# Patient Record
Sex: Female | Born: 1970 | Race: White | Hispanic: No | Marital: Married | Smoking: Current every day smoker
Health system: Southern US, Community
[De-identification: ages and names within clinical notes are randomized; demographics above are authoritative.]

## PROBLEM LIST (undated history)

## (undated) DIAGNOSIS — F319 Bipolar disorder, unspecified: Secondary | ICD-10-CM

## (undated) DIAGNOSIS — F329 Major depressive disorder, single episode, unspecified: Secondary | ICD-10-CM

## (undated) DIAGNOSIS — J45909 Unspecified asthma, uncomplicated: Secondary | ICD-10-CM

## (undated) DIAGNOSIS — F32A Depression, unspecified: Secondary | ICD-10-CM

## (undated) DIAGNOSIS — M5430 Sciatica, unspecified side: Secondary | ICD-10-CM

## (undated) DIAGNOSIS — F419 Anxiety disorder, unspecified: Secondary | ICD-10-CM

## (undated) DIAGNOSIS — M797 Fibromyalgia: Secondary | ICD-10-CM

## (undated) HISTORY — PX: CHOLECYSTECTOMY: SHX55

## (undated) HISTORY — PX: APPENDECTOMY: SHX54

## (undated) HISTORY — PX: ABDOMINAL HYSTERECTOMY: SHX81

---

## 1998-10-01 ENCOUNTER — Other Ambulatory Visit: Admission: RE | Admit: 1998-10-01 | Discharge: 1998-10-01 | Payer: Self-pay | Admitting: Obstetrics and Gynecology

## 2000-10-05 ENCOUNTER — Other Ambulatory Visit: Admission: RE | Admit: 2000-10-05 | Discharge: 2000-10-05 | Payer: Self-pay | Admitting: Obstetrics and Gynecology

## 2001-09-25 ENCOUNTER — Encounter: Payer: Self-pay | Admitting: Family Medicine

## 2001-09-25 ENCOUNTER — Encounter: Admission: RE | Admit: 2001-09-25 | Discharge: 2001-09-25 | Payer: Self-pay | Admitting: Family Medicine

## 2006-10-11 ENCOUNTER — Encounter: Admission: RE | Admit: 2006-10-11 | Discharge: 2006-10-11 | Payer: Self-pay | Admitting: Family Medicine

## 2018-07-12 ENCOUNTER — Emergency Department (HOSPITAL_COMMUNITY)
Admission: EM | Admit: 2018-07-12 | Discharge: 2018-07-13 | Disposition: A | Discharge status: Home / Self Care | Payer: Self-pay | Attending: Emergency Medicine | Admitting: Emergency Medicine

## 2018-07-12 ENCOUNTER — Emergency Department (HOSPITAL_COMMUNITY): Payer: Self-pay

## 2018-07-12 ENCOUNTER — Other Ambulatory Visit: Payer: Self-pay

## 2018-07-12 ENCOUNTER — Encounter (HOSPITAL_COMMUNITY): Payer: Self-pay | Admitting: *Deleted

## 2018-07-12 DIAGNOSIS — J45901 Unspecified asthma with (acute) exacerbation: Secondary | ICD-10-CM | POA: Insufficient documentation

## 2018-07-12 DIAGNOSIS — F17210 Nicotine dependence, cigarettes, uncomplicated: Secondary | ICD-10-CM | POA: Insufficient documentation

## 2018-07-12 DIAGNOSIS — J189 Pneumonia, unspecified organism: Secondary | ICD-10-CM | POA: Insufficient documentation

## 2018-07-12 HISTORY — DX: Fibromyalgia: M79.7

## 2018-07-12 HISTORY — DX: Depression, unspecified: F32.A

## 2018-07-12 HISTORY — DX: Major depressive disorder, single episode, unspecified: F32.9

## 2018-07-12 HISTORY — DX: Sciatica, unspecified side: M54.30

## 2018-07-12 HISTORY — DX: Unspecified asthma, uncomplicated: J45.909

## 2018-07-12 HISTORY — DX: Anxiety disorder, unspecified: F41.9

## 2018-07-12 HISTORY — DX: Bipolar disorder, unspecified: F31.9

## 2018-07-12 LAB — POC URINE PREG, ED: PREG TEST UR: NEGATIVE

## 2018-07-12 LAB — URINALYSIS, ROUTINE W REFLEX MICROSCOPIC
Glucose, UA: NEGATIVE mg/dL
Hgb urine dipstick: NEGATIVE
Ketones, ur: NEGATIVE mg/dL
Leukocytes, UA: NEGATIVE
Nitrite: NEGATIVE
Protein, ur: 100 mg/dL — AB
SPECIFIC GRAVITY, URINE: 1.033 — AB (ref 1.005–1.030)
pH: 5 (ref 5.0–8.0)

## 2018-07-12 LAB — BASIC METABOLIC PANEL
Anion gap: 11 (ref 5–15)
BUN: 9 mg/dL (ref 6–20)
CALCIUM: 9.4 mg/dL (ref 8.9–10.3)
CO2: 27 mmol/L (ref 22–32)
Chloride: 99 mmol/L (ref 98–111)
Creatinine, Ser: 0.88 mg/dL (ref 0.44–1.00)
GFR calc Af Amer: >60 mL/min (ref >60)
Glucose, Bld: 110 mg/dL — ABNORMAL HIGH (ref 70–99)
Potassium: 3 mmol/L — ABNORMAL LOW (ref 3.5–5.1)
Sodium: 137 mmol/L (ref 135–145)

## 2018-07-12 LAB — CBC WITH DIFFERENTIAL/PLATELET
Abs Immature Granulocytes: 0.06 10*3/uL (ref 0.00–0.07)
BASOS PCT: 0 %
Basophils Absolute: 0 10*3/uL (ref 0.0–0.1)
Eosinophils Absolute: 0.3 10*3/uL (ref 0.0–0.5)
Eosinophils Relative: 2 %
HCT: 50 % — ABNORMAL HIGH (ref 36.0–46.0)
Hemoglobin: 16.2 g/dL — ABNORMAL HIGH (ref 12.0–15.0)
Immature Granulocytes: 0 %
Lymphocytes Relative: 17 %
Lymphs Abs: 2.4 10*3/uL (ref 0.7–4.0)
MCH: 30.5 pg (ref 26.0–34.0)
MCHC: 32.4 g/dL (ref 30.0–36.0)
MCV: 94 fL (ref 80.0–100.0)
MONOS PCT: 6 %
Monocytes Absolute: 0.8 10*3/uL (ref 0.1–1.0)
NEUTROS PCT: 75 %
Neutro Abs: 10.5 10*3/uL — ABNORMAL HIGH (ref 1.7–7.7)
Platelets: 205 10*3/uL (ref 150–400)
RBC: 5.32 MIL/uL — ABNORMAL HIGH (ref 3.87–5.11)
RDW: 12.8 % (ref 11.5–15.5)
WBC: 14 10*3/uL — ABNORMAL HIGH (ref 4.0–10.5)
nRBC: 0 % (ref 0.0–0.2)

## 2018-07-12 MED ORDER — ALBUTEROL (5 MG/ML) CONTINUOUS INHALATION SOLN
10.0000 mg/h | INHALATION_SOLUTION | Freq: Once | RESPIRATORY_TRACT | Status: AC
  Administered 2018-07-12: 10 mg/h via RESPIRATORY_TRACT
  Filled 2018-07-12: qty 20

## 2018-07-12 MED ORDER — IOPAMIDOL (ISOVUE-300) INJECTION 61%
75.0000 mL | Freq: Once | INTRAVENOUS | Status: AC | PRN
  Administered 2018-07-12: 75 mL via INTRAVENOUS

## 2018-07-12 MED ORDER — SODIUM CHLORIDE 0.9 % IV BOLUS
500.0000 mL | Freq: Once | INTRAVENOUS | Status: AC
  Administered 2018-07-12: 500 mL via INTRAVENOUS

## 2018-07-12 MED ORDER — PREDNISONE 20 MG PO TABS
40.0000 mg | ORAL_TABLET | Freq: Once | ORAL | Status: AC
Start: 2018-07-12 — End: 2018-07-12
  Administered 2018-07-12: 40 mg via ORAL
  Filled 2018-07-12: qty 2

## 2018-07-12 MED ORDER — ALBUTEROL SULFATE (2.5 MG/3ML) 0.083% IN NEBU
2.5000 mg | INHALATION_SOLUTION | Freq: Once | RESPIRATORY_TRACT | Status: AC
  Administered 2018-07-12: 2.5 mg via RESPIRATORY_TRACT
  Filled 2018-07-12: qty 3

## 2018-07-12 MED ORDER — POTASSIUM CHLORIDE CRYS ER 20 MEQ PO TBCR
40.0000 meq | EXTENDED_RELEASE_TABLET | Freq: Once | ORAL | Status: AC
  Administered 2018-07-12: 40 meq via ORAL
  Filled 2018-07-12: qty 2

## 2018-07-12 MED ORDER — IPRATROPIUM-ALBUTEROL 0.5-2.5 (3) MG/3ML IN SOLN
3.0000 mL | Freq: Once | RESPIRATORY_TRACT | Status: AC
  Administered 2018-07-12: 3 mL via RESPIRATORY_TRACT
  Filled 2018-07-12: qty 3

## 2018-07-12 NOTE — ED Triage Notes (Signed)
Pt c/o fever with cough and dizziness x 2 days; pt states the cough is non-productive

## 2018-07-12 NOTE — ED Notes (Signed)
Patient transported to CT 

## 2018-07-12 NOTE — ED Provider Notes (Signed)
[facility] EMERGENCY DEPARTMENT Provider Note   CSN: [id number] Arrival date & time: 07/12/18  2048     History   Chief Complaint Chief Complaint  Patient presents with  . Fever    HPI Gina Cunningham is a 47 y.o. female.  HPI   Gina Cunningham is a 47 y.o. female asthma who presents to the Emergency Department complaining of cough, chest tightness, and wheezing.  Symptoms have been present for 2 days.  Mild shortness of breath associated with cough as well.  She states she has been using her albuterol inhaler without relief.  She also describes intermittent dizziness associated with excessive coughing or sudden position change.  She denies chest pain, abdominal pain, fever, chills, creased appetite or vomiting.  Other family members have been sick recently.  She states that when she gets sick it usually exacerbates her asthma.   Past Medical History:  Diagnosis Date  . Anxiety   . Asthma   . Bipolar 1 disorder (HCC)   . Depression   . Fibromyalgia   . Sciatica     There are no active problems to display for this patient.   Past Surgical History:  Procedure Laterality Date  . ABDOMINAL HYSTERECTOMY    . APPENDECTOMY    . CESAREAN SECTION    . CHOLECYSTECTOMY       OB History   No obstetric history on file.      Home Medications    Prior to Admission medications   Not on File    Family History History reviewed. No pertinent family history.  Social History Social History   Tobacco Use  . Smoking status: Current Every Day Smoker    Packs/day: 1.00    Types: Cigarettes  . Smokeless tobacco: Never Used  Substance Use Topics  . Alcohol use: Not Currently  . Drug use: Not Currently     Allergies   Penicillins   Review of Systems Review of Systems  Constitutional: Negative for appetite change, chills and fever.  HENT: Positive for congestion and rhinorrhea. Negative for sore throat and trouble swallowing.   Respiratory: Positive for cough, chest  tightness, shortness of breath and wheezing.   Cardiovascular: Negative for chest pain.  Gastrointestinal: Negative for abdominal pain, nausea and vomiting.  Genitourinary: Negative for dysuria.  Musculoskeletal: Negative for arthralgias, neck pain and neck stiffness.  Skin: Negative for rash.  Neurological: Positive for dizziness. Negative for syncope, weakness, light-headedness, numbness and headaches.  Hematological: Negative for adenopathy.     Physical Exam Updated Vital Signs BP 124/73 (BP Location: Right Arm)   Pulse (!) 111   Temp 98 F (36.7 C) (Oral)   Resp 20   Ht 5\' 4"  (1.626 m)   Wt 95.3 kg   SpO2 94%   BMI 36.05 kg/m   Physical Exam Vitals signs and nursing note reviewed.  Constitutional:      General: She is not in acute distress.    Appearance: She is not toxic-appearing.     Comments: Uncomfortable appearing  HENT:     Head: Atraumatic.     Right Ear: Tympanic membrane and ear canal normal.     Left Ear: Tympanic membrane and ear canal normal.     Nose: Congestion present.     Mouth/Throat:     Mouth: Mucous membranes are moist.     Pharynx: Oropharynx is clear. No oropharyngeal exudate or posterior oropharyngeal erythema.  Neck:     Musculoskeletal: Normal range of motion. No neck  rigidity.  Cardiovascular:     Rate and Rhythm: Regular rhythm. Tachycardia present.     Pulses: Normal pulses.  Pulmonary:     Breath sounds: Wheezing present. No rales.     Comments: Diffuse expiratory wheezes and rhonchi are present. Breathing is mildly labored, but she is able to speak in complete sentences without respiratory distress. Abdominal:     General: There is no distension.     Palpations: Abdomen is soft.     Tenderness: There is no abdominal tenderness.  Musculoskeletal: Normal range of motion.        General: No swelling.  Lymphadenopathy:     Cervical: No cervical adenopathy.  Skin:    General: Skin is warm.     Capillary Refill: Capillary refill  takes less than 2 seconds.     Findings: No rash.  Neurological:     General: No focal deficit present.     Mental Status: She is alert. Mental status is at baseline.     Sensory: No sensory deficit.  Psychiatric:        Mood and Affect: Mood normal.      ED Treatments / Results  Labs (all labs ordered are listed, but only abnormal results are displayed) Labs Reviewed  CBC WITH DIFFERENTIAL/PLATELET - Abnormal; Notable for the following components:      Result Value   WBC 14.0 (*)    RBC 5.32 (*)    Hemoglobin 16.2 (*)    HCT 50.0 (*)    Neutro Abs 10.5 (*)    All other components within normal limits  BASIC METABOLIC PANEL - Abnormal; Notable for the following components:   Potassium 3.0 (*)    Glucose, Bld 110 (*)    All other components within normal limits  URINALYSIS, ROUTINE W REFLEX MICROSCOPIC - Abnormal; Notable for the following components:   Color, Urine AMBER (*)    APPearance HAZY (*)    Specific Gravity, Urine 1.033 (*)    Bilirubin Urine SMALL (*)    Protein, ur 100 (*)    Bacteria, UA RARE (*)    All other components within normal limits  POC URINE PREG, ED    EKG None  Radiology Dg Chest 2 View  Result Date: 07/12/2018 CLINICAL DATA:  Fever with nonproductive cough. EXAM: CHEST - 2 VIEW COMPARISON:  None. FINDINGS: The cardiopericardial silhouette is within normal limits for size. Right hilar prominence, likely vasculature. The lungs are clear without focal pneumonia, edema, pneumothorax or pleural effusion. Interstitial markings are diffusely coarsened with chronic features. The visualized bony structures of the thorax are intact. IMPRESSION: Prominence in the right hilum is probably related to pulmonary vessels, but the appearance is asymmetric. CT chest with contrast recommended to exclude lymphadenopathy. Chronic interstitial coarsening without acute cardiopulmonary findings. Electronically Signed   By: Kennith CenterEric  Mansell M.D.   On: 07/12/2018 21:46    Ct Chest W Contrast  Result Date: 07/12/2018 CLINICAL DATA:  Fever and cough with dizziness for 2 days. Right hilar prominence on chest radiograph. EXAM: CT CHEST WITH CONTRAST TECHNIQUE: Multidetector CT imaging of the chest was performed during intravenous contrast administration. CONTRAST:  75mL ISOVUE-300 IOPAMIDOL (ISOVUE-300) INJECTION 61% COMPARISON:  Plain film of earlier today FINDINGS: Cardiovascular: Normal caliber of the aorta and branch vessels. Normal heart size, without pericardial effusion. Pulmonary artery enlargement, outflow tract 3.2 cm No central pulmonary embolism, on this non-dedicated study. Mediastinum/Nodes: Low right paratracheal 1.5 cm node on image 45/2. A right hilar node of  10 mm is not pathologic by size criteria. Upper normal subcarinal node of 12 mm. Lungs/Pleura: No pleural fluid. Bilateral slightly peribronchovascular predominant ground-glass opacity with relative sparing of the right lung base. Upper Abdomen: Cholecystectomy. Moderate to marked hepatic steatosis. Normal imaged portions of the spleen, stomach, pancreas, adrenal glands, left kidney. Musculoskeletal: No acute osseous abnormality. IMPRESSION: 1. Bilateral peribronchovascular ground-glass opacities with relative sparing of the right lung base. Most consistent with infection, including atypical etiologies (i.e. Viral or mycoplasma). 2. Mild thoracic adenopathy, likely reactive. 3. Pulmonary artery enlargement suggests pulmonary arterial hypertension. 4. Hepatic steatosis. Electronically Signed   By: Jeronimo GreavesKyle  Talbot M.D.   On: 07/12/2018 23:31    Procedures Procedures (including critical care time)  Medications Ordered in ED Medications  ipratropium-albuterol (DUONEB) 0.5-2.5 (3) MG/3ML nebulizer solution 3 mL (has no administration in time range)  albuterol (PROVENTIL) (2.5 MG/3ML) 0.083% nebulizer solution 2.5 mg (has no administration in time range)  predniSONE (DELTASONE) tablet 40 mg (has no  administration in time range)     Initial Impression / Assessment and Plan / ED Course  I have reviewed the triage vital signs and the nursing notes.  Pertinent labs & imaging results that were available during my care of the patient were reviewed by me and considered in my medical decision making (see chart for details).     Pt with history of asthma, no improvement with albuterol MDI at home.  Audible wheezing on exam.  No hypoxia.  Will order chest x-ray, prednisone and albuterol neb and reassess.  On recheck after albuterol, patient continues to have audible wheezing and rhonchi.  Tachycardia present.  O2 sat drops to 88-89% but rises to mid 90's with deep breath and coughing.  Will order continuous neb. Mild hypokalemia, potassium given.   0005  Discussed CT chest findings.  Pt still receiving continous neb.    0110  Pt finished continuous albuterol neb.  Lung sounds remain coarse, but overall improved.  She ambulated in the dept with O2 sat of 92% on RA.  Pt prefers d/c home.  She has albuterol nebulizer at home and MDI.  Will give initial abx dose here and rx's for Vantin and zithromax and refill her albuterol vials.  She agrees to close PCP f/u on Monday and strict return precautions discussed.    Final Clinical Impressions(s) / ED Diagnoses   Final diagnoses:  Community acquired pneumonia, unspecified laterality  Moderate asthma with exacerbation, unspecified whether persistent    ED Discharge Orders    None       Pauline Ausriplett, Wanya Bangura, PA-C 07/13/18 0134    Vanetta MuldersZackowski, Scott, MD 07/19/18 [phone]

## 2018-07-13 MED ORDER — AZITHROMYCIN 250 MG PO TABS: ORAL_TABLET | ORAL | 0 refills | Status: DC

## 2018-07-13 MED ORDER — AZITHROMYCIN 250 MG PO TABS
500.0000 mg | ORAL_TABLET | Freq: Once | ORAL | Status: AC
  Administered 2018-07-13: 500 mg via ORAL
  Filled 2018-07-13: qty 2

## 2018-07-13 MED ORDER — AZITHROMYCIN 250 MG PO TABS: 1000.0000 mg | ORAL_TABLET | Freq: Once | ORAL | Status: DC

## 2018-07-13 MED ORDER — ALBUTEROL SULFATE (2.5 MG/3ML) 0.083% IN NEBU: 2.5000 mg | INHALATION_SOLUTION | Freq: Four times a day (QID) | RESPIRATORY_TRACT | 1 refills | Status: DC | PRN

## 2018-07-13 MED ORDER — CEFPODOXIME PROXETIL 200 MG PO TABS: 200.0000 mg | ORAL_TABLET | Freq: Two times a day (BID) | ORAL | 0 refills | Status: DC

## 2018-07-13 NOTE — Discharge Instructions (Addendum)
Its important that you take the antibiotics as directed until they are finished.  Drink plenty of fluids.  Tylenol if needed for fever.  Use your albuterol nebulizer one treatment every 4 hrs as needed while you are sick.  Be sure to follow-up with your primary doctor for recheck on Monday or Tuesday.  Return here for any worsening symptoms.

## 2018-07-13 NOTE — ED Notes (Signed)
Patient 92 percent while ambulating with pulse rate of 132. Patient stated she started to feel a little dizzy while ambulating but states she is feeling better.

## 2020-07-02 ENCOUNTER — Emergency Department (HOSPITAL_COMMUNITY): Admission: EM | Admit: 2020-07-02 | Discharge: 2020-07-02 | Discharge status: Against Medical Advice | Payer: Self-pay

## 2020-07-02 ENCOUNTER — Other Ambulatory Visit: Payer: Self-pay

## 2020-07-06 ENCOUNTER — Emergency Department (HOSPITAL_COMMUNITY)
Admission: EM | Admit: 2020-07-06 | Discharge: 2020-07-06 | Disposition: A | Discharge status: Home / Self Care | Payer: PRIVATE HEALTH INSURANCE | Attending: Emergency Medicine | Admitting: Emergency Medicine

## 2020-07-06 ENCOUNTER — Encounter (HOSPITAL_COMMUNITY): Payer: Self-pay | Admitting: Emergency Medicine

## 2020-07-06 ENCOUNTER — Emergency Department (HOSPITAL_COMMUNITY): Payer: PRIVATE HEALTH INSURANCE

## 2020-07-06 ENCOUNTER — Other Ambulatory Visit: Payer: Self-pay

## 2020-07-06 DIAGNOSIS — S62664A Nondisplaced fracture of distal phalanx of right ring finger, initial encounter for closed fracture: Secondary | ICD-10-CM | POA: Diagnosis not present

## 2020-07-06 DIAGNOSIS — S60944A Unspecified superficial injury of right ring finger, initial encounter: Secondary | ICD-10-CM | POA: Diagnosis present

## 2020-07-06 DIAGNOSIS — J45909 Unspecified asthma, uncomplicated: Secondary | ICD-10-CM | POA: Insufficient documentation

## 2020-07-06 DIAGNOSIS — W108XXA Fall (on) (from) other stairs and steps, initial encounter: Secondary | ICD-10-CM | POA: Diagnosis not present

## 2020-07-06 DIAGNOSIS — F17210 Nicotine dependence, cigarettes, uncomplicated: Secondary | ICD-10-CM | POA: Diagnosis not present

## 2020-07-06 DIAGNOSIS — W19XXXA Unspecified fall, initial encounter: Secondary | ICD-10-CM

## 2020-07-06 DIAGNOSIS — S92412A Displaced fracture of proximal phalanx of left great toe, initial encounter for closed fracture: Secondary | ICD-10-CM

## 2020-07-06 MED ORDER — NAPROXEN 500 MG PO TABS: 500.0000 mg | ORAL_TABLET | Freq: Two times a day (BID) | ORAL | 0 refills | Status: DC | PRN

## 2020-07-06 NOTE — ED Provider Notes (Signed)
[facility] EMERGENCY DEPARTMENT Provider Note   CSN: [id number] Arrival date & time: 07/06/20  1615     History Chief Complaint  Patient presents with  . Finger Injury    Gina Cunningham is a 49 y.o. female with a hx of fibromyalgia, depression, anxiety, & bipolar 1 disorder who presents to the ED with complaints of right 4th finger pain and left foot pain S/p fall 4 days prior. Patient states she tripped going down the last step of a flight and fell forward onto her right hand and knees. Denies head injury or LOC. Having pain to the right 4th finger and to the left 1st/2nd toes. Worse with movement. Associated swelling/bruising- seems to be mildly improving. Denies headache, neck pain, back pain, wounds, chest pain, or abdominal pain.   HPI     Past Medical History:  Diagnosis Date  . Anxiety   . Asthma   . Bipolar 1 disorder (HCC)   . Depression   . Fibromyalgia   . Sciatica     There are no problems to display for this patient.   Past Surgical History:  Procedure Laterality Date  . ABDOMINAL HYSTERECTOMY    . APPENDECTOMY    . CESAREAN SECTION    . CHOLECYSTECTOMY       OB History   No obstetric history on file.     History reviewed. No pertinent family history.  Social History   Tobacco Use  . Smoking status: Current Every Day Smoker    Packs/day: 1.00    Types: Cigarettes  . Smokeless tobacco: Never Used  Vaping Use  . Vaping Use: Never used  Substance Use Topics  . Alcohol use: Not Currently  . Drug use: Not Currently    Home Medications Prior to Admission medications   Medication Sig Start Date End Date Taking? Authorizing Provider  albuterol (PROVENTIL) (2.5 MG/3ML) 0.083% nebulizer solution Take 3 mLs (2.5 mg total) by nebulization every 6 (six) hours as needed for wheezing or shortness of breath. 07/13/18   Triplett, Tammy, PA-C  azithromycin (ZITHROMAX) 250 MG tablet Take first 2 tablets together, then 1 every day until finished. 07/13/18    Triplett, Tammy, PA-C  cefpodoxime (VANTIN) 200 MG tablet Take 1 tablet (200 mg total) by mouth 2 (two) times daily. 07/13/18   Triplett, Tammy, PA-C    Allergies    Penicillins  Review of Systems   Review of Systems  Constitutional: Negative for chills and fever.  Respiratory: Negative for shortness of breath.   Cardiovascular: Negative for chest pain.  Gastrointestinal: Negative for abdominal pain.  Musculoskeletal: Positive for arthralgias and joint swelling. Negative for back pain and neck pain.  Skin: Positive for color change. Negative for wound.  Neurological: Negative for weakness and numbness.  All other systems reviewed and are negative.   Physical Exam Updated Vital Signs BP (!) 111/50 (BP Location: Left Arm)   Pulse (!) 104   Temp 98.4 F (36.9 C) (Oral)   Resp 18   Ht 5\' 4"  (1.626 m)   Wt 117.9 kg   SpO2 99%   BMI 44.63 kg/m   Physical Exam Vitals and nursing note reviewed.  Constitutional:      General: She is not in acute distress.    Appearance: She is not toxic-appearing.  HENT:     Head: Normocephalic and atraumatic.     Comments: No racoon eyes or battle sign.  Eyes:     Pupils: Pupils are equal, round, and reactive to  light.  Neck:     Comments: No midline tenderness.  Cardiovascular:     Rate and Rhythm: Normal rate and regular rhythm.     Comments: 2+ symmetric radial & DP pulses bilaterally.  Pulmonary:     Effort: Pulmonary effort is normal.     Breath sounds: Normal breath sounds.  Chest:     Chest wall: No tenderness.  Abdominal:     General: There is no distension.     Palpations: Abdomen is soft.     Tenderness: There is no abdominal tenderness.  Musculoskeletal:     Cervical back: Normal range of motion and neck supple.     Comments: UEs: Bruising/swelling to the proximal phalanx of the right 4th finger. No open wounds. Intact AROM throughout the upper extremities with the exception of right 4th finger IP/MCP joints with mild  limitation in flexion- able to do so through majority of range of motion and against resistance with flexion/extension. Tender to the right 4th proximal/middle/distal phalanx and PIP/DIP joint. Otherwise nontender.  Back: No midline tenderness or palpable step off.  Lower extremities: Mild bruising to the dorsal aspect of the 1st and 2nd left phalanges/forefoot. No open wounds. Able to move all digits. Intact AROM throughout major joints. Tender to the 1st & 2nd phalanx as well as to the medial forefoot of the left foot. Otherwise nontender to palpation. No tenderness to the base of the 5th, malleoli, or the knees.   Neurological:     Mental Status: She is alert.     Comments: 5/5 strength with plantar/dorsiflexion bilaterally. Sensation grossly intact x 4. Able to perform OK sign, thumbs up and cross 2nd/3rd digits with bilateral upper extremities.     ED Results / Procedures / Treatments   Labs (all labs ordered are listed, but only abnormal results are displayed) Labs Reviewed - No data to display  EKG None  Radiology DG Finger Ring Right  Result Date: 07/06/2020 CLINICAL DATA:  Finger injury, fall EXAM: RIGHT RING FINGER 2+V COMPARISON:  None. FINDINGS: Acute nondisplaced fracture involving the proximal aspect of the fourth distal phalanx with possible articular surface extension on the oblique view. Positive for soft tissue swelling IMPRESSION: Acute nondisplaced distal phalanx fracture with possible articular extension to the DIP joint Electronically Signed   By: Jasmine Pang M.D.   On: 07/06/2020 17:33   DG Foot Complete Left  Result Date: 07/06/2020 CLINICAL DATA:  Toe injury EXAM: LEFT FOOT - COMPLETE 3+ VIEW COMPARISON:  None. FINDINGS: Acute mildly displaced intra-articular fracture involving the medial base of the first proximal phalanx. No subluxation. IMPRESSION: Acute mildly displaced intra-articular fracture involving the base of the first proximal phalanx. Electronically  Signed   By: Jasmine Pang M.D.   On: 07/06/2020 17:31    Procedures .Splint Application  Date/Time: 07/06/2020 5:46 PM Performed by: Cherly Anderson, PA-C Authorized by: Cherly Anderson, PA-C    SPLINT APPLICATION Date/Time: 5:46 PM Authorized by: Harvie Heck Consent: Verbal consent obtained. Risks and benefits: risks, benefits and alternatives were discussed Consent given by: patient Splint applied by: nursing staff Location details: R ring finger Splint type: finger splint Supplies used: finger splint Post-procedure: The splinted body part was neurovascularly unchanged following the procedure. Patient tolerance: Patient tolerated the procedure well with no immediate complications.   (including critical care time)  Medications Ordered in ED Medications - No data to display  ED Course  I have reviewed the triage vital signs and the nursing notes.  Pertinent labs & imaging results that were available during my care of the patient were reviewed by me and considered in my medical decision making (see chart for details).    MDM Rules/Calculators/A&P                         Patient presents to the ED S/p mechanical fall with complaints of right 4th finger pain and left foot pain. Nontoxic. No signs of serious head/neck/back or intra thoracic/abdominal injury on exam.   I have ordered x-rays in areas of tenderness on exam, personally reviewed & interpreted imaging- in agreement with radiology read- left foot: Acute mildly displaced intra-articular fracture involving the base of the first proximal phalanx & right 4th finger: Acute nondisplaced distal phalanx fracture with possible articular extension to the DIP joint  Finger- aluminum splint.  Toe- buddy tape & post op shoe.   Naproxen prescription- chart reviewed for additional hx last creatinine WNL. PRICE. Orthopedics follow up. I discussed results, treatment plan, need for follow-up, and return  precautions with the patient. Provided opportunity for questions, patient confirmed understanding and is in agreement with plan.    Final Clinical Impression(s) / ED Diagnoses Final diagnoses:  Fall, initial encounter  Displaced fracture of proximal phalanx of left great toe, initial encounter for closed fracture  Nondisplaced fracture of distal phalanx of right ring finger, initial encounter for closed fracture    Rx / DC Orders ED Discharge Orders         Ordered    naproxen (NAPROSYN) 500 MG tablet  2 times daily PRN        07/06/20 1751           Cherly Anderson, PA-C 07/06/20 1813    Sabas Sous, MD 07/06/20 2313

## 2020-07-06 NOTE — ED Triage Notes (Signed)
Pt c/o an injury to her right ring finger after a fall last Friday.  pt also states she has an injury to her left big toe from the same fall.

## 2020-07-06 NOTE — Discharge Instructions (Addendum)
Please read and follow all provided instructions.  You have been seen today after injuries related to a fall.  Your xrays show fractures of your left first toe and your right 4th finger.  We have placed you in a splint of the finger, please keep this clean & dry and intact until you have followed up with orthopedics. We have also buddy taped your  1st/2nd toes- please continue to do so daily and utilize post op shoe.  Please call orthopedic surgery tomorrow to schedule an appointment within the next 3 days.   Home care instructions: -- *PRICE in the first 24-48 hours: Protect with splint Rest Ice- Do not apply ice pack directly to your splint place towel or similar between your splint and ice/ice pack. Apply ice for 20 min, then remove for 40 min while awake Compression- splint Elevate affected extremity above the level of your heart when not walking around for the first 24-48 hours   Medications:  - Naproxen is a nonsteroidal anti-inflammatory medication that will help with pain and swelling. Be sure to take this medication as prescribed with food, 1 pill every 12 hours,  It should be taken with food, as it can cause stomach upset, and more seriously, stomach bleeding. Do not take other nonsteroidal anti-inflammatory medications with this such as Advil, Motrin, Aleve, Mobic, Goodie Powder, or Motrin.     We have prescribed you new medication(s) today. Discuss the medications prescribed today with your pharmacist as they can have adverse effects and interactions with your other medicines including over the counter and prescribed medications. Seek medical evaluation if you start to experience new or abnormal symptoms after taking one of these medicines, seek care immediately if you start to experience difficulty breathing, feeling of your throat closing, facial swelling, or rash as these could be indications of a more serious allergic reaction   Follow-up instructions: Please follow-up with the  orthopedic surgeon in your discharge instructions within 1 week.   Return instructions:  Please return if your digits or extremity are numb or tingling, appear gray or blue, or you have severe pain (also elevate the extremity and loosen splint or wrap if you were given one) Please return if you have redness or fevers.  Please return to the Emergency Department if you experience worsening symptoms.  Please return if you have any other emergent concerns. Additional Information:  Your vital signs today were: BP (!) 111/50 (BP Location: Left Arm)   Pulse (!) 104   Temp 98.4 F (36.9 C) (Oral)   Resp 18   Ht 5\' 4"  (1.626 m)   Wt 117.9 kg   SpO2 99%   BMI 44.63 kg/m  If your blood pressure (BP) was elevated above 135/85 this visit, please have this repeated by your doctor within one month. ---------------

## 2021-05-16 IMAGING — DX DG FOOT COMPLETE 3+V*L*
3 series · 3 of 3 positions shown · non-contrast
Comparison: None.

CLINICAL DATA: Toe injury

EXAM:
LEFT FOOT - COMPLETE 3+ VIEW

[foot ap]
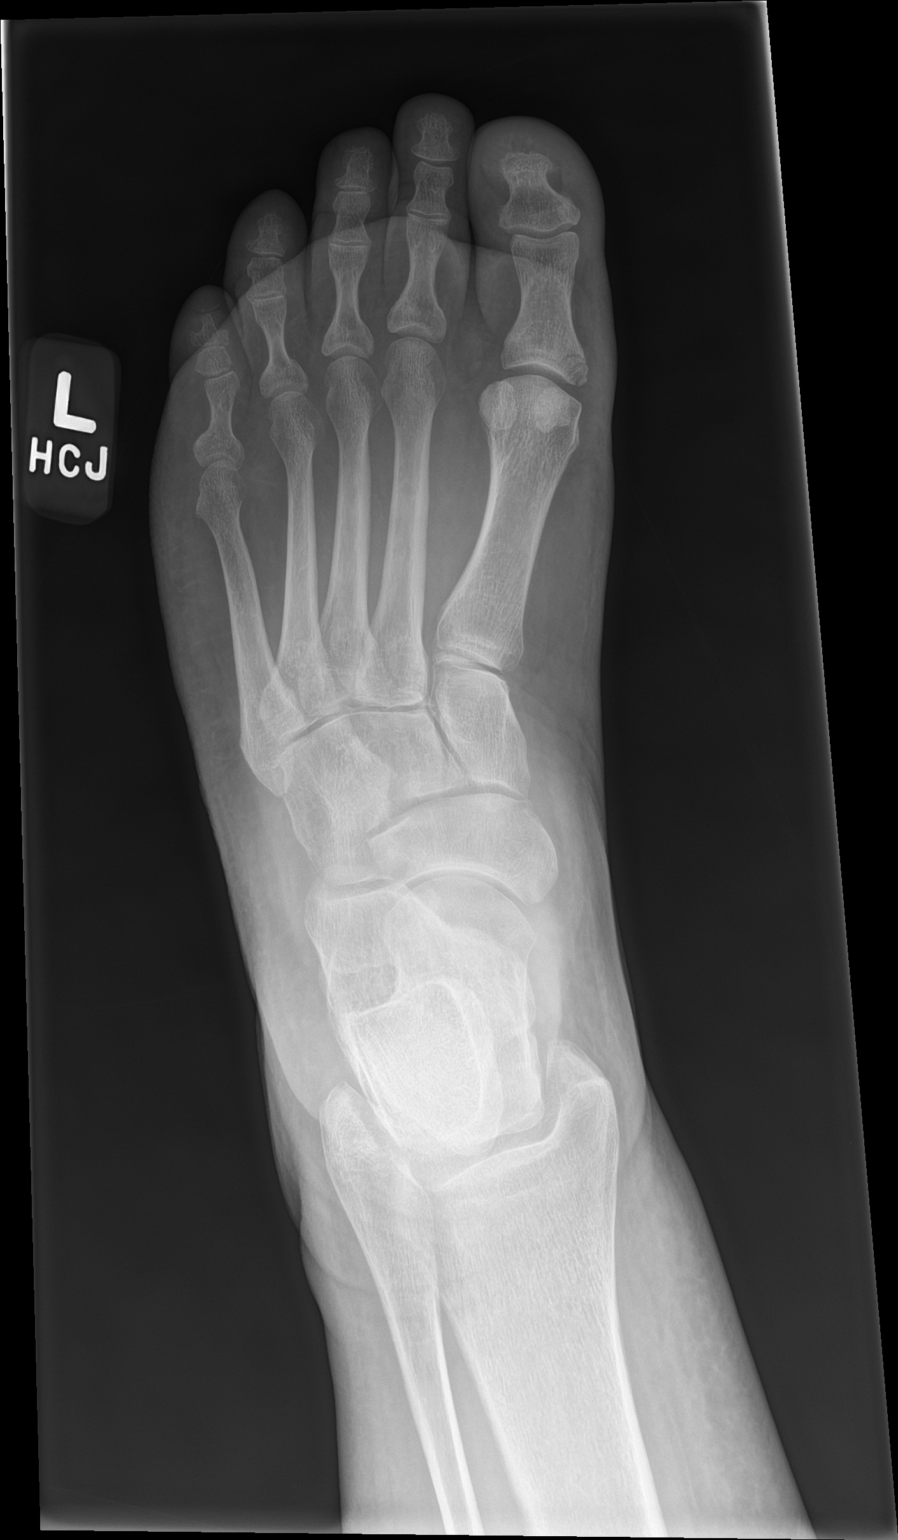

[foot obl]
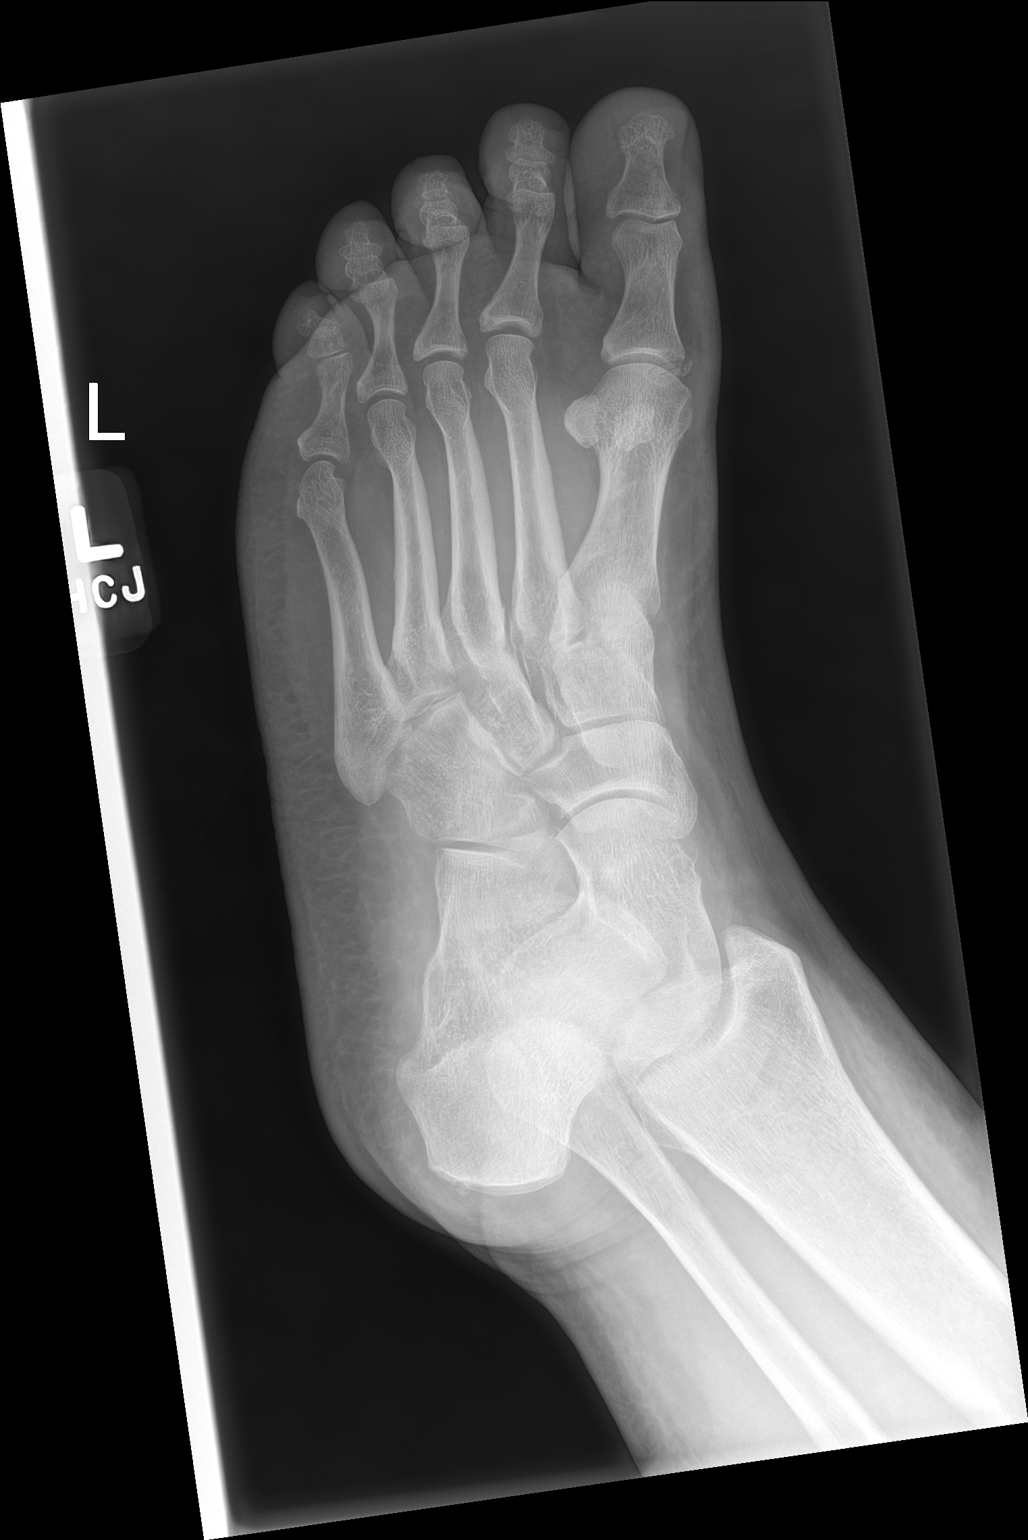

[foot lat]
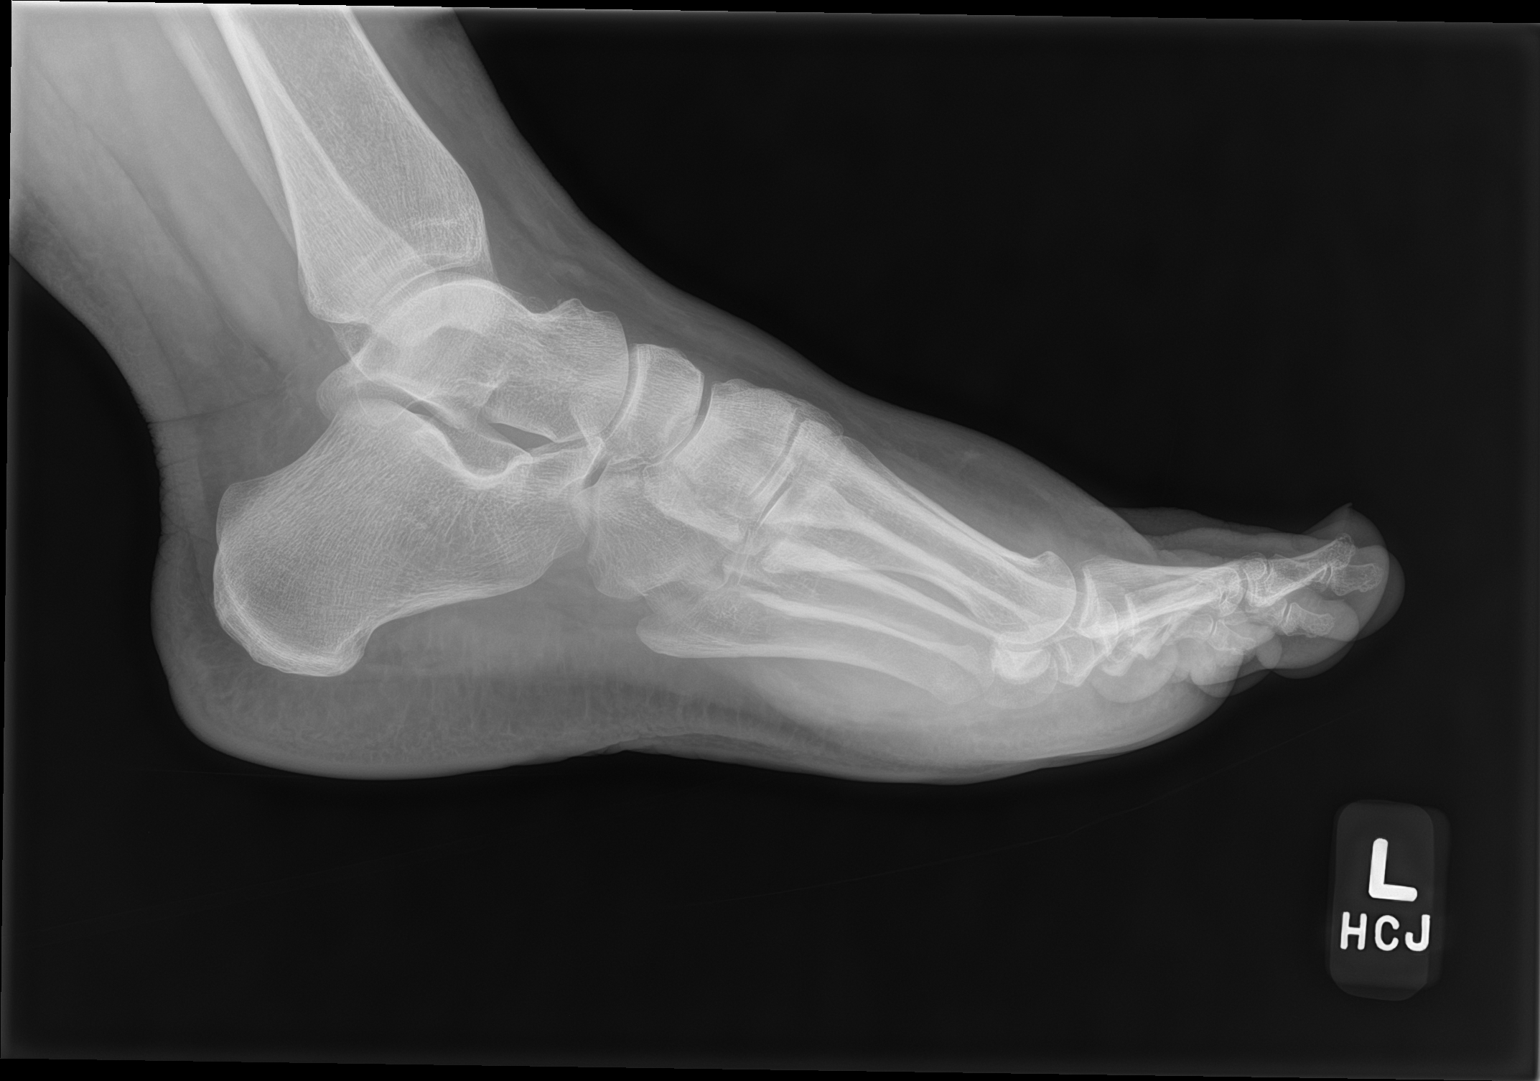

[3 of 3 positions shown; findings below may reference images not displayed]

FINDINGS: Acute mildly displaced intra-articular fracture involving the medial
base of the first proximal phalanx. No subluxation.
IMPRESSION: Acute mildly displaced intra-articular fracture involving the base
of the first proximal phalanx.

## 2022-10-06 ENCOUNTER — Other Ambulatory Visit (HOSPITAL_COMMUNITY): Payer: Self-pay | Admitting: Adult Medicine

## 2022-10-06 DIAGNOSIS — Z1231 Encounter for screening mammogram for malignant neoplasm of breast: Secondary | ICD-10-CM

## 2022-10-18 ENCOUNTER — Ambulatory Visit (HOSPITAL_COMMUNITY): Payer: PRIVATE HEALTH INSURANCE

## 2022-10-18 ENCOUNTER — Encounter (HOSPITAL_COMMUNITY): Payer: Self-pay

## 2022-10-19 ENCOUNTER — Ambulatory Visit (HOSPITAL_COMMUNITY): Payer: PRIVATE HEALTH INSURANCE

## 2022-11-28 ENCOUNTER — Other Ambulatory Visit: Payer: Self-pay

## 2022-11-28 ENCOUNTER — Emergency Department (HOSPITAL_COMMUNITY)
Admission: EM | Admit: 2022-11-28 | Discharge: 2022-11-28 | Disposition: A | Discharge status: Home / Self Care | Payer: No Typology Code available for payment source | Attending: Emergency Medicine | Admitting: Emergency Medicine

## 2022-11-28 ENCOUNTER — Encounter (HOSPITAL_COMMUNITY): Payer: Self-pay | Admitting: Emergency Medicine

## 2022-11-28 DIAGNOSIS — M5442 Lumbago with sciatica, left side: Secondary | ICD-10-CM | POA: Insufficient documentation

## 2022-11-28 DIAGNOSIS — M5432 Sciatica, left side: Secondary | ICD-10-CM | POA: Diagnosis present

## 2022-11-28 MED ORDER — KETOROLAC TROMETHAMINE 30 MG/ML IJ SOLN
30.0000 mg | Freq: Once | INTRAMUSCULAR | Status: AC
  Administered 2022-11-28: 30 mg via INTRAMUSCULAR
  Filled 2022-11-28: qty 1

## 2022-11-28 MED ORDER — PREDNISONE 10 MG (21) PO TBPK: ORAL_TABLET | Freq: Every day | ORAL | 0 refills | Status: DC

## 2022-11-28 MED ORDER — OXYCODONE-ACETAMINOPHEN 5-325 MG PO TABS
1.0000 | ORAL_TABLET | Freq: Once | ORAL | Status: AC
  Administered 2022-11-28: 1 via ORAL
  Filled 2022-11-28: qty 1

## 2022-11-28 MED ORDER — PREDNISONE 50 MG PO TABS
60.0000 mg | ORAL_TABLET | Freq: Once | ORAL | Status: AC
  Administered 2022-11-28: 60 mg via ORAL
  Filled 2022-11-28: qty 1

## 2022-11-28 NOTE — ED Notes (Signed)
See triage notes. Pt tearful

## 2022-11-28 NOTE — Discharge Instructions (Signed)
As discussed, we will send in prednisone taper for your sciatica type pain.  Continue at home medications as prescribed.  Attached is number for follow-up for spinal specialist.  Please do not hesitate to return to emergency department if the worrisome signs and symptoms we discussed become apparent.

## 2022-11-28 NOTE — ED Provider Notes (Signed)
[place] EMERGENCY DEPARTMENT AT [facility] Provider Note   CSN: [id number] Arrival date & time: 11/28/22  1251     History  Chief Complaint  Patient presents with   Leg Pain    Gina Cunningham is a 52 y.o. female.   Leg Pain   52 year old female presents emergency department complaints of "left-sided sciatica."  Patient states she has a history of low back pain as well as intermittent left-sided sciatica.  Follows with neurosurgery outpatient through [facility] for said symptoms.  States that she is has received injections in her spine.  Reports exacerbation symptoms approximately 2 days ago.  Patient states that she had her granddaughter and was playing with her when she noted pain in her left buttock region with radiation down her left foot.  Reports baseline numbness in bilateral feet due to neuropathy with no change.  Denies any weakness in affected leg, saddle anesthesia, bowel/bladder dysfunction, fever, history of IV drug use, prolonged corticosteroid use.  Denies any abdominal pain, urinary symptoms, change in bowel habits.  Past medical history significant for Polar 1 disorder, fibromyalgia, anxiety, sciatica  Home Medications Prior to Admission medications   Medication Sig Start Date End Date Taking? Authorizing Provider  predniSONE (STERAPRED UNI-PAK 21 TAB) 10 MG (21) TBPK tablet Take by mouth daily. Take 6 tabs by mouth daily  for 2 days, then 5 tabs for 2 days, then 4 tabs for 2 days, then 3 tabs for 2 days, 2 tabs for 2 days, then 1 tab by mouth daily for 2 days 11/28/22  Yes Sherian Maroon A, PA  albuterol (PROVENTIL) (2.5 MG/3ML) 0.083% nebulizer solution Take 3 mLs (2.5 mg total) by nebulization every 6 (six) hours as needed for wheezing or shortness of breath. 07/13/18   Triplett, Tammy, PA-C  ALPRAZolam (XANAX) 1 MG tablet Take 1 tablet by mouth 3 (three) times daily. 06/11/20   [provider]  beclomethasone (QVAR) 40 MCG/ACT inhaler Inhale 1 puff into  the lungs daily as needed. 11/21/13   [provider]  DULoxetine (CYMBALTA) 60 MG capsule Take 1 capsule by mouth in the morning and at bedtime. 12/02/19 12/01/20  [provider]  gabapentin (NEURONTIN) 600 MG tablet Take 1 tablet by mouth 3 (three) times daily. 12/02/19 12/01/20  [provider]  levalbuterol Pauline Aus HFA) 45 MCG/ACT inhaler Inhale into the lungs.    [provider]  naproxen (NAPROSYN) 500 MG tablet Take 1 tablet (500 mg total) by mouth 2 (two) times daily as needed for moderate pain. 07/06/20   Petrucelli, Samantha R, PA-C  phentermine 37.5 MG capsule Take 1 capsule by mouth daily. 05/13/20 11/09/20  [provider]  vitamin B-12 (CYANOCOBALAMIN) 100 MCG tablet Take 100 mcg by mouth daily.    [provider]      Allergies    Penicillins    Review of Systems   Review of Systems  All other systems reviewed and are negative.   Physical Exam Updated Vital Signs BP 116/83 (BP Location: Right Arm)   Pulse (!) 106   Temp 98.4 F (36.9 C) (Oral)   Resp 20   Ht 5\' 4"  (1.626 m)   Wt 131.5 kg   SpO2 99%   BMI 49.78 kg/m  Physical Exam Vitals and nursing note reviewed.  Constitutional:      General: She is not in acute distress.    Appearance: She is well-developed.  HENT:     Head: Normocephalic and atraumatic.  Eyes:  Conjunctiva/sclera: Conjunctivae normal.  Cardiovascular:     Rate and Rhythm: Normal rate and regular rhythm.     Heart sounds: No murmur heard. Pulmonary:     Effort: Pulmonary effort is normal. No respiratory distress.     Breath sounds: Normal breath sounds.  Abdominal:     Palpations: Abdomen is soft.     Tenderness: There is no abdominal tenderness.  Musculoskeletal:        General: No swelling.     Cervical back: Neck supple.     Comments: No midline tenderness of cervical, thoracic, lumbar spine with no obvious type of deformity noted.  Tenderness to palpation in left buttock region.   Straight leg raise positive on left.  Muscular strength out of 5 for bilateral lower extremities.  No sensory deficits all major nutrition presents of lower extremities besides plantar aspect of feet bilaterally.  Pedal pulses 2+ bilaterally.  No obvious overlying skin abnormalities.  DTR symmetric and patellar    Skin:    General: Skin is warm and dry.     Capillary Refill: Capillary refill takes less than 2 seconds.  Neurological:     Mental Status: She is alert.  Psychiatric:        Mood and Affect: Mood normal.     ED Results / Procedures / Treatments   Labs (all labs ordered are listed, but only abnormal results are displayed) Labs Reviewed - No data to display  EKG None  Radiology No results found.  Procedures Procedures    Medications Ordered in ED Medications  oxyCODONE-acetaminophen (PERCOCET/ROXICET) 5-325 MG per tablet 1 tablet (1 tablet Oral Given 11/28/22 1357)  ketorolac (TORADOL) 30 MG/ML injection 30 mg (30 mg Intramuscular Given 11/28/22 1357)  predniSONE (DELTASONE) tablet 60 mg (60 mg Oral Given 11/28/22 1357)    ED Course/ Medical Decision Making/ A&P                             Medical Decision Making Risk Prescription drug management.   This patient presents to the ED for concern of left low back pain, this involves an extensive number of treatment options, and is a complaint that carries with it a high risk of complications and morbidity.  The differential diagnosis includes cauda equina, spinal epidural abscess, strain/sprain, dislocation, fracture, sciatica, ischemic limb, DVT, cellulitis, erysipelas   Co morbidities that complicate the patient evaluation  See HPI   Additional history obtained:  Additional history obtained from EMR External records from outside source obtained and reviewed including hospital records   Lab Tests:  N/a   Imaging Studies ordered:  N/a   Cardiac Monitoring: / EKG:  The patient was maintained on a  cardiac monitor.  I personally viewed and interpreted the cardiac monitored which showed an underlying rhythm of: Sinus rhythm   Consultations Obtained:  N/a   Problem List / ED Course / Critical interventions / Medication management  Left sciatica I ordered medication including Toradol, Percocet, prednisone  Reevaluation of the patient after these medicines showed that the patient improved I have reviewed the patients home medicines and have made adjustments as needed   Social Determinants of Health:  Denies tobacco, illicit drug use   Test / Admission - Considered:  Left-sided sciatica Vitals signs significant for initial tachycardia with heart rate of 1060 which decreased with time elapsed and medicines administered on emergency department.. Otherwise within normal range and stable throughout visit. 52 year old female presents  with left-sided sciatica patient without evidence of red flag signs for back pain on physical exam or HPI.  Low suspicion for acute spinal cord compression such as cauda equina/spinal epidural abscess, etc.  No traumatic mechanism of injury so low suspicion for acute fracture/dislocation.  No clinical evidence for acute vascular compromise low suspicion for PAD.  Patient without lower extremity swelling so low suspicion for DVT.  Patient already on gabapentin, muscle relaxer, nonsteroidal medication, opioid medication.  She does state that she has had improvement in the past with prednisone so we will trial prednisone taper outpatient.  Will recommend follow-up with neurosurgery outpatient for reevaluation of symptoms.  Treatment plan discussed at length with patient and she acknowledged understanding was agreeable to said plan. Worrisome signs and symptoms were discussed with the patient, and the patient acknowledged understanding to return to the ED if noticed. Patient was stable upon discharge.          Final Clinical Impression(s) / ED Diagnoses Final  diagnoses:  Left sided sciatica    Rx / DC Orders ED Discharge Orders          Ordered    predniSONE (STERAPRED UNI-PAK 21 TAB) 10 MG (21) TBPK tablet  Daily        11/28/22 1358              Peter Garter, [place] 11/28/22 1558    Gerhard Munch, MD 11/28/22 1625

## 2022-11-28 NOTE — ED Triage Notes (Signed)
Pt via POV c/o left leg pain due to sciatica since yesterday with hx of same. Spinal surgery in 2017. Pt is ambulatory w/o assistance but seems uncomfortable and is tearful in triage. Took 2 prescribed 5-325mg  hydrocodone tablets at around 9am without relief.

## 2022-12-01 ENCOUNTER — Ambulatory Visit (HOSPITAL_COMMUNITY): Payer: PRIVATE HEALTH INSURANCE

## 2022-12-13 ENCOUNTER — Ambulatory Visit
Admission: RE | Admit: 2022-12-13 | Discharge: 2022-12-13 | Disposition: A | Discharge status: Home / Self Care | Payer: No Typology Code available for payment source | Source: Ambulatory Visit | Attending: Nurse Practitioner | Admitting: Nurse Practitioner

## 2022-12-13 ENCOUNTER — Other Ambulatory Visit: Payer: Self-pay | Admitting: Nurse Practitioner

## 2022-12-13 DIAGNOSIS — M545 Low back pain, unspecified: Secondary | ICD-10-CM

## 2023-10-13 ENCOUNTER — Other Ambulatory Visit: Payer: Self-pay

## 2023-10-13 ENCOUNTER — Ambulatory Visit: Admission: EM | Admit: 2023-10-13 | Discharge: 2023-10-13 | Disposition: A | Discharge status: Home / Self Care

## 2023-10-13 ENCOUNTER — Encounter: Payer: Self-pay | Admitting: Emergency Medicine

## 2023-10-13 DIAGNOSIS — L02412 Cutaneous abscess of left axilla: Secondary | ICD-10-CM

## 2023-10-13 MED ORDER — SULFAMETHOXAZOLE-TRIMETHOPRIM 800-160 MG PO TABS: 1.0000 | ORAL_TABLET | Freq: Two times a day (BID) | ORAL | 0 refills | Status: AC

## 2023-10-13 NOTE — Discharge Instructions (Addendum)
 Take antibiotic as prescribed Apply warm compress a few times per day  Return if no improvement or symptoms become worse.

## 2023-10-13 NOTE — ED Triage Notes (Signed)
 Reports a bump to left axilla.  Patient noticed this a week ago.  Wednesday saw someone in pcp office-thought a swollen lymph node.  Was scheduled for an ultrasound April 3.  On Thursday, patient touched this sight and drained immediately. Patient reports this site is slightly swollen.  Marland Kitchen

## 2023-10-13 NOTE — ED Provider Notes (Signed)
 [facility]    CSN: [id number] Arrival date & time: 10/13/23  1516      History   Chief Complaint Chief Complaint  Patient presents with   Abscess    HPI Gina Cunningham is a 53 y.o. female.   Pt presents with a small bump to the left axilla that she noticed about a week ago.  She reports she was seen by her PCP who thought it was just a swollen lymph node.  She states few days ago the area started draining.  She reports draining over the last 2 to 3 days.  She denies fever, chills.  She has tried nothing for the symptoms at this time.  She does report she is scheduled for an ultrasound on April 3.    Past Medical History:  Diagnosis Date   Anxiety    Asthma    Bipolar 1 disorder (HCC)    Depression    Fibromyalgia    Sciatica     There are no active problems to display for this patient.   Past Surgical History:  Procedure Laterality Date   ABDOMINAL HYSTERECTOMY     APPENDECTOMY     CESAREAN SECTION     CHOLECYSTECTOMY      OB History   No obstetric history on file.      Home Medications    Prior to Admission medications   Medication Sig Start Date End Date Taking? Authorizing Provider  ARIPiprazole (ABILIFY) 5 MG tablet Take 1 tablet by mouth daily. 06/18/23 06/17/24 Yes [provider]  cyclobenzaprine (FLEXERIL) 10 MG tablet Take by mouth. 05/30/22 09/19/24 Yes [provider]  sulfamethoxazole-trimethoprim (BACTRIM DS) 800-160 MG tablet Take 1 tablet by mouth 2 (two) times daily for 5 days. 10/13/23 10/18/23 Yes Ward, Tylene Fantasia, PA-C  valACYclovir (VALTREX) 500 MG tablet Take 500 mg by mouth 2 (two) times daily.   Yes [provider]  albuterol (PROVENTIL) (2.5 MG/3ML) 0.083% nebulizer solution Take 3 mLs (2.5 mg total) by nebulization every 6 (six) hours as needed for wheezing or shortness of breath. 07/13/18   Triplett, Tammy, PA-C  ALPRAZolam (XANAX) 1 MG tablet Take 1 tablet by mouth 3 (three) times daily. 06/11/20    [provider]  beclomethasone (QVAR) 40 MCG/ACT inhaler Inhale 1 puff into the lungs daily as needed. 11/21/13   [provider]  DULoxetine (CYMBALTA) 60 MG capsule Take 1 capsule by mouth in the morning and at bedtime. 12/02/19 12/01/20  [provider]  gabapentin (NEURONTIN) 600 MG tablet Take 1 tablet by mouth 3 (three) times daily. 12/02/19 12/01/20  [provider]  levalbuterol Pauline Aus HFA) 45 MCG/ACT inhaler Inhale into the lungs.    [provider]  naproxen (NAPROSYN) 500 MG tablet Take 1 tablet (500 mg total) by mouth 2 (two) times daily as needed for moderate pain. 07/06/20   Petrucelli, Samantha R, PA-C  phentermine 37.5 MG capsule Take 1 capsule by mouth daily. 05/13/20 11/09/20  [provider]  predniSONE (STERAPRED UNI-PAK 21 TAB) 10 MG (21) TBPK tablet Take by mouth daily. Take 6 tabs by mouth daily  for 2 days, then 5 tabs for 2 days, then 4 tabs for 2 days, then 3 tabs for 2 days, 2 tabs for 2 days, then 1 tab by mouth daily for 2 days 11/28/22   Peter Garter, PA  vitamin B-12 (CYANOCOBALAMIN) 100 MCG tablet Take 100 mcg by mouth daily.    [provider]  Family History History reviewed. No pertinent family history.  Social History Social History   Tobacco Use   Smoking status: Former    Current packs/day: 1.00    Types: Cigarettes   Smokeless tobacco: Never  Vaping Use   Vaping status: Some Days  Substance Use Topics   Alcohol use: Not Currently   Drug use: Not Currently     Allergies   Penicillins   Review of Systems Review of Systems  Constitutional:  Negative for chills and fever.  HENT:  Negative for ear pain and sore throat.   Eyes:  Negative for pain and visual disturbance.  Respiratory:  Negative for cough and shortness of breath.   Cardiovascular:  Negative for chest pain and palpitations.  Gastrointestinal:  Negative for abdominal pain and vomiting.  Genitourinary:  Negative for  dysuria and hematuria.  Musculoskeletal:  Negative for arthralgias and back pain.  Skin:  Positive for wound. Negative for color change and rash.  Neurological:  Negative for seizures and syncope.  All other systems reviewed and are negative.    Physical Exam Triage Vital Signs ED Triage Vitals  Encounter Vitals Group     BP 10/13/23 1536 131/73     Systolic BP Percentile --      Diastolic BP Percentile --      Pulse Rate 10/13/23 1536 97     Resp 10/13/23 1536 20     Temp 10/13/23 1536 99.1 F (37.3 C)     Temp Source 10/13/23 1536 Oral     SpO2 10/13/23 1536 99 %     Weight --      Height --      Head Circumference --      Peak Flow --      Pain Score 10/13/23 1532 0     Pain Loc --      Pain Education --      Exclude from Growth Chart --    No data found.  Updated Vital Signs BP 131/73 (BP Location: Right Arm) Comment (BP Location): large cuff  Pulse 97   Temp 99.1 F (37.3 C) (Oral)   Resp 20   SpO2 99%   Visual Acuity Right Eye Distance:   Left Eye Distance:   Bilateral Distance:    Right Eye Near:   Left Eye Near:    Bilateral Near:     Physical Exam Vitals and nursing note reviewed.  Constitutional:      General: She is not in acute distress.    Appearance: She is well-developed.  HENT:     Head: Normocephalic and atraumatic.  Eyes:     Conjunctiva/sclera: Conjunctivae normal.  Cardiovascular:     Rate and Rhythm: Normal rate and regular rhythm.     Heart sounds: No murmur heard. Pulmonary:     Effort: Pulmonary effort is normal. No respiratory distress.     Breath sounds: Normal breath sounds.  Abdominal:     Palpations: Abdomen is soft.     Tenderness: There is no abdominal tenderness.  Musculoskeletal:        General: No swelling.     Cervical back: Neck supple.  Skin:    General: Skin is warm and dry.     Capillary Refill: Capillary refill takes less than 2 seconds.     Comments: Left axilla with small bump, no fluctuance at this  time noted.  No surrounding redness or warmth.  Mild tenderness to palpation..  Neurological:     Mental  Status: She is alert.  Psychiatric:        Mood and Affect: Mood normal.      UC Treatments / Results  Labs (all labs ordered are listed, but only abnormal results are displayed) Labs Reviewed - No data to display  EKG   Radiology No results found.  Procedures Procedures (including critical care time)  Medications Ordered in UC Medications - No data to display  Initial Impression / Assessment and Plan / UC Course  I have reviewed the triage vital signs and the nursing notes.  Pertinent labs & imaging results that were available during my care of the patient were reviewed by me and considered in my medical decision making (see chart for details).     Abscess to left axilla that has drained on its own.  She has some persistent mild tenderness.  No fluctuance noticed at this time no need to drain.  Will initiate antibiotic and advised to follow-up if no improvement.  Supportive care discussed. Final Clinical Impressions(s) / UC Diagnoses   Final diagnoses:  Abscess of axilla, left     Discharge Instructions      Take antibiotic as prescribed Apply warm compress a few times per day  Return if no improvement or symptoms become worse.    ED Prescriptions     Medication Sig Dispense Auth. Provider   sulfamethoxazole-trimethoprim (BACTRIM DS) 800-160 MG tablet Take 1 tablet by mouth 2 (two) times daily for 5 days. 10 tablet Ward, Tylene Fantasia, PA-C      PDMP not reviewed this encounter.   Ward, Tylene Fantasia, PA-C 10/13/23 1550

## 2024-07-29 NOTE — Progress Notes (Signed)
 Location Information: Patient State (at time of visit): [place]  Patient Location (at time of visit):Home/Other Non-Medical  Provider Location: Home Is provider licensed to provide clinical care in the current location/state of the patient? Yes   Consent:  Patient's identity was confirmed. Presenting condition or illness was discussed with the patient/personal representative. Current proposed treatment for presenting condition or illness was explained to patient/personal representative along with the likely benefits and any significant risks or complications associated with the provision of treatment by audio/video means. The patient/personal representative verbally authorized treatment to be provided by audio/video, which may include a limited review of patient's current health status, medication, or other treatment recommendations, patient education, and an opportunity to ask questions about condition and treatment. Verbal Consent Granted by Patient/Personal Representative:Yes   Visit Information: Modality: 2-Way Real-Time Audio/Video  Video Start Time: 1:30pm Video Stop Time: 1:50pm Video Total Time: 20 minutes   [facility]: Postoperative Psychological Therapy Note  *This note may contain sensitive and confidential information regarding the patient's psychosocial adjustment to living with a chronic medical condition.  DO NOT share this information outside of [facility] without written consent from the patient explicitly stating that mental health records may be released.  DOS: Tue 07/29/2024 Service: Individual psychotherapy Billing Codes: 09167 (16-37 minute therapy)  Limits of confidentiality, credentials and role of the psychologist, purpose, nature, and expectations of postoperative visit and risk and benefits of therapy and telehealth were discussed: Yes  Patient verbally consented to the information  reviewed above: Yes  Subjective/Objective: Pt presents today for a 2-4 week postoperative appointment to assess postoperative psychosocial functioning and to obtain support around postoperative lifestyle changes.   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 Encouraged patient to transition to more pureed food based options at this point in time. Appears RD discussed this with patient last week as well. Reviewed appropriate food choices and pt has materials to refer to.    Nutrition and Eating Behaviors:  Typical foods consumed in the past week: 3 protein shakes/day, broccoli and cabbage  Hydration/fluid intake: 64 Patient adhering to appropriate post-op diet phase: Yes Disordered/maladaptive eating behaviors: Denies Cravings: Yes, sweets cravings. Utilizing SF popcicles  Food sensitivity/aversions: Denies Patient reports pain when eating/drinking: No Vitamin adherence: Yes  Psychosocial Symptoms, Behaviors, and Concerns:  Self-reported mood: Good New or worsening psychiatric symptoms/concerns: Denies Body image concerns: Denies Weighing behavior: Daily, concerned about recent weight fluctuations. 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  Interventions: Supportive listening and expressive techniques to normalize patient's  postoperative experiences and assist patient in tolerating and effectively coping with any psychosocial symptoms, negative affect, and/or stressors.  Psychoeducation related to the relationship between emotional distress, interpersonal relationships, weight loss, and food/eating behaviors. Cognitive and behavioral interventions, including cognitive restructuring and behavior modification, related to mood, psychiatric distress, health behavior change, and food/eating behaviors. Provided patient with specific recommendations to optimize bariatric surgery outcomes.  Problem solving techniques to assist developing a plan and setting goals.  In-Session Response:  Patient responded positively to in-session interventions.  Mental Status: Appearance: age appropriate and casually dressed Behavior: normal Motor: unremarkable and within normal limits Level of Consciousness: alert Attitude: cooperative, agreeable, and respectful Speech: normal volume and normal pitch Mood: euthymic Affect: congruent with mood Thought Process: goal-directed Thought Content: logical Memory: No apparent problems Judgment: No apparent problems Insight: No apparent problems  Suicide/Risk Assessment: No active/recent suicidal ideation, intent, or plan. No further risk was indicated or observed.  Potential for Assault/Violence: Low  Diagnostic Impressions: Encounter Diagnoses  Name Primary?   Adjustment disorder, unspecified type Yes   S/P biliopancreatic diversion with duodenal switch      Plan of Care*: Implement behavioral health and dietary goals discussed in today's session and as recommended by surgical team. Attend 3 to 6 month and 9 to 12 month postoperative behavioral health group appointments. Regularly attend monthly behavioral health surgical support group on the 3rd Tuesday of every month at 12pm. Maintain exercise routine as tolerated and recommended by exercise physiologist and medical  providers. Follow-up with surgical team, including behavioral health, as-needed for optimal postoperative care.  * The patient actively participated in the treatment planning process and goal setting  _____________________ Damien BROCKS. Audery, PhD Assistant Professor Licensed Counsellor, [phone] [facility]  [facility]

## 2024-08-07 NOTE — Progress Notes (Addendum)
 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  She was previously unaware of a having cirrhosis until a biopsy was performed at the time of a bariatric surgery by Dr. Winfred last month.  She is unaware of exactly how the liver appeared to the surgery team during the procedure.  She does admit to having a history of fatty liver disease dating back several years.  Last abdominal imaging was a CT performed in 06/2021, which per the interpreting radiologist indicated fatty liver but did not show evidence of cirrhosis at that time. Her liver chemistries largely appear to have been within normal limits other than mild elevations in alkaline phosphatase and ALT occasionally.  She reports having 20 pound weight loss since the surgery.  She reports having 40 additional pound weight loss prior to surgery.  She was diagnosed last week with iron  deficiency anemia and was advised to start supplementing her diet with an iron  supplementation and vitamin C.  She underwent a colonoscopy in 2022 which demonstrated 2 sessile serrated polyps in the sigmoid colon and was advised to have 5-year recall.  She states that she did have an EGD performed preoperatively recently.  She denies having any jaundice or acholic stools.  She denies melena or hematochezia.  She denies ascites lower extremity.  She denies having any confusion.  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  Review of Systems: All other systems were reviewed and are negative.  Physical Exam: Vital Signs: BP 137/71   Pulse 79   Temp 97.9 F (36.6 C) (Temporal)   Ht 1.626 m (5' 4)   Wt 122 kg (268 lb)   LMP  (LMP Unknown)   SpO2 98%   BMI 46.00 kg/m  Constitutional: Well-developed and in no apparent distress.  Accompanied by aunt Eyes: Anicteric sclerae. Ears, Nose, Mouth, Throat: Moist mucous membranes, no oral lesions. Respiratory: Clear lungs to auscultation bilaterally. No wheezes, rales, rhonchi, or crackles.  Cardiovascular: Normal S1 and S2. Regular rate and rhythm. No murmurs, rubs or gallops. No peripheral edema. Gastrointestinal: Abdomen soft, non-tender, and non-distended. No masses palpable.  Several surgical scars are present Musculoskeletal: No clubbing or cyanosis of hands. Normal range of motion in upper and lower extremities. Skin: No spider angiomata, palmar erythema, or jaundice.  Neurologic: Fluent language. No facial asymmetry. Moves all extremities equally. No asterixis. Psychiatric: Alert. Normal affect.  Records Review: I have personally reviewed prior records. The laboratory and imaging data are summarized within the history of present illness and below.   Labs:  [facility] Labs: Lab Results  Component Value Date   WBC 6.40 07/31/2024   RBC 3.43 (L) 07/31/2024   HGB 9.9 (L) 07/31/2024   HCT 29.7 (L) 07/31/2024   MCV 86.5 07/31/2024   MCH 28.8 07/31/2024   MCHC 33.3 07/31/2024   PLT 194 07/31/2024     Chemistry  Component Value Date/Time   NA 144 07/31/2024 1014   K 3.0 (L) 07/31/2024 1014   CL 108 (H) 07/31/2024 1014   CO2 31 07/31/2024 1014   BUN 7 07/31/2024 1014   CREATININE 0.64 07/31/2024 1014      Component Value Date/Time    CALCIUM 8.9 07/31/2024 1014   AST 24 07/31/2024 1014   ALT 24 07/31/2024 1014   BILITOT 0.4 07/31/2024 1014      Imaging:   CT Abdomen with and without contrast (06/30/2021): ABDOMEN/PELVIS .  Kidneys: Redemonstrated right renal cyst on the lower pole, approximately 3.5 cm in diameter, previously up to 5.7 cm. Minimal residual perinephric stranding. No associated loculated drainable collection. Enhancement is symmetric. No hydronephrosis or radiopaque stones. .  Ureters: Visualized portions unremarkable. .  Liver: Redemonstrated diffuse hepatic steatosis. .  Gallbladder/biliary: Cholecystectomy.   Procedures:   Colonoscopy (02/21/2021): Findings Internal hemorrhoids Two sessile polyps measuring 5-9 mm in the sigmoid colon; removed by cold snare; placed 1 clip successfully  Liver Biopsy (06/30/2024): A. Liver, core biopsy:   Hepatic cirrhosis with mild macrovesicular steatosis (affecting approximately 20% of hepatic parenchyma)    and steatohepatitis; see Comment.   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  Assessment & Plan 1.  MASLD cirrhosis: - Presumed diagnosis based on liver biopsy results.  However, her fib-4 score would argue against her having advanced fibrosis (1.29 score).  Regardless, liver biopsy has historically been the gold standard for diagnosing cirrhosis - No symptoms or other signs of decompensated liver disease at this time Plan: - Will check updated INR to calculate MELD score today (I can use the CMP from last week) - Viral hepatitis profile ordered for screening purposes as well as to assess immunity and I will also  check ceruloplasmin as well - AFP and triphasic CT imaging ordered for [facility] screening purposes - I have counseled her at length today on the signs and symptoms of decompensated liver disease to monitor for - Continue healthy diet and exercise for the goal of weight loss, which will ultimately improve her fatty liver disease.  I have explained to her, however, that rapid weight loss can exacerbate cirrhosis and fatty liver disease in the postoperative setting of weight loss surgery.  I have seen this happen on numerous occasions in the past.   - I will update Dr. Winfred that I have seen the patient today and let him know of my recommendations.  I will also touch base with him as well on EGD for possible variceal screening particular in setting of iron  deficiency anemia as well as to discuss findings of the liver during the surgery last month  2.  Iron  deficiency anemia - Denies any overt GI bleeding at this time - Possibly secondary to absorption in the setting of weight loss surgery last month Plan: - As above, will reach out to Dr. Winfred to see if he wishes for our GI team to perform diagnostic EGD - Continue iron  supplementation recommendations otherwise per surgery and PCP    Follow-up: 6 months  Rosalynn Pottier, MD Assistant Professor [facility]  ADDENDUM: Messaged Dr. Winfred. He did EGD during surgery and no evidence of varices. Would like to hold on EGD given possibility of anemia due to operation. Liver appeared nodular grossly during surgery. Thus biopsy was undertaken      [1] Current Outpatient Medications  Medication Sig Dispense Refill   acetaminophen  (TYLENOL ) 500 mg tablet Take 1,000 mg by mouth every 6 (six) hours as needed for mild pain (1-3).     albuterol  HFA (PROVENTIL  HFA;VENTOLIN  HFA;PROAIR  HFA) 90 mcg/actuation inhaler Inhale 2 puffs into the lungs every 4 (four) hours as needed for Wheezing. 8.5 g 11   ALPRAZolam  (XANAX ) 1 mg  tablet Take 1 tablet (1 mg total) by mouth 3 (three) times daily as needed for anxiety. 90 tablet 3   busPIRone (BUSPAR) 30 mg tablet Take 1 tablet (30 mg total) by mouth 2 times daily. 180 tablet 3   cyanocobalamin  (VITAMIN B12) 1,000 mcg tablet Take 1 tablet by mouth daily.     cyclobenzaprine  (FLEXERIL ) 10 mg tablet Take 1 tablet (10 mg total) by mouth 3 (three) times daily as needed for muscles. 90 tablet 2   DULoxetine  (CYMBALTA ) 60 mg capsule TAKE ONE CAPSULE BY MOUTH 2 TIMES A DAY FOR MOOD AND BACK 60 capsule 11   gabapentin  (NEURONTIN ) 600 mg tablet Take 1 tablet (600 mg total) by mouth 3 times daily. 90 tablet 11   ipratropium-albuteroL  (DUO-NEB) 0.5-2.5 mg/3 mL nebulizer solution Take 3 mL by nebulization 3 (three) times a day as needed. (Patient taking differently: Take 3 mL by nebulization 3 (three) times a day as needed for wheezing or shortness of breath.) 270 mL 2   lidocaine HCL-menthoL (Nervive Pain Relieving) 4-1 % srlo Apply 1 Application topically daily as needed (NERVE PAIN).     metFORMIN (GLUCOPHAGE) 500 mg tablet Take 1 tablet (500 mg total) by mouth in the morning and 1 tablet (500 mg total) in the evening. Take with meals. 180 tablet 3   multivitamin (One Daily Multivitamin) tab tablet Take 1 tablet by mouth daily.     omeprazole (PriLOSEC) 20 mg DR capsule Take 1 capsule (20 mg total) by mouth in the morning. 90 capsule 1   promethazine (PHENERGAN) 25 mg tablet Take 1 tablet (25 mg total) by mouth every 6 (six) hours as needed for nausea or vomiting. 20 tablet 0   sennosides-docusate sodium (PERICOLACE) 8.6-50 mg per tablet Take 1 tablet by mouth 2 (two) times a day as needed for constipation. 60 tablet 3   valACYclovir  (VALTREX ) 500 mg tablet Take 1 tablet (500 mg total) by mouth daily. (Patient taking differently: Take 500 mg by mouth daily.) 90 tablet 3   No current facility-administered medications for this visit.

## 2024-08-25 ENCOUNTER — Emergency Department (HOSPITAL_COMMUNITY)
Admission: EM | Admit: 2024-08-25 | Discharge: 2024-08-26 | Disposition: A | Attending: Emergency Medicine | Admitting: Emergency Medicine

## 2024-08-25 ENCOUNTER — Encounter (HOSPITAL_COMMUNITY): Payer: Self-pay

## 2024-08-25 ENCOUNTER — Other Ambulatory Visit: Payer: Self-pay

## 2024-08-25 DIAGNOSIS — F1310 Sedative, hypnotic or anxiolytic abuse, uncomplicated: Secondary | ICD-10-CM | POA: Insufficient documentation

## 2024-08-25 DIAGNOSIS — F32A Depression, unspecified: Secondary | ICD-10-CM

## 2024-08-25 DIAGNOSIS — R45851 Suicidal ideations: Secondary | ICD-10-CM | POA: Insufficient documentation

## 2024-08-25 DIAGNOSIS — F4321 Adjustment disorder with depressed mood: Secondary | ICD-10-CM | POA: Insufficient documentation

## 2024-08-25 DIAGNOSIS — Y900 Blood alcohol level of less than 20 mg/100 ml: Secondary | ICD-10-CM | POA: Insufficient documentation

## 2024-08-25 DIAGNOSIS — F3130 Bipolar disorder, current episode depressed, mild or moderate severity, unspecified: Secondary | ICD-10-CM | POA: Diagnosis present

## 2024-08-25 DIAGNOSIS — F319 Bipolar disorder, unspecified: Secondary | ICD-10-CM | POA: Insufficient documentation

## 2024-08-25 DIAGNOSIS — F314 Bipolar disorder, current episode depressed, severe, without psychotic features: Secondary | ICD-10-CM

## 2024-08-25 DIAGNOSIS — F411 Generalized anxiety disorder: Secondary | ICD-10-CM | POA: Insufficient documentation

## 2024-08-25 DIAGNOSIS — J45909 Unspecified asthma, uncomplicated: Secondary | ICD-10-CM | POA: Insufficient documentation

## 2024-08-25 LAB — CBC WITH DIFFERENTIAL/PLATELET
Abs Immature Granulocytes: 0.03 10*3/uL (ref 0.00–0.07)
Basophils Absolute: 0 10*3/uL (ref 0.0–0.1)
Basophils Relative: 0 %
Eosinophils Absolute: 0.4 10*3/uL (ref 0.0–0.5)
Eosinophils Relative: 4 %
HCT: 37.6 % (ref 36.0–46.0)
Hemoglobin: 11.5 g/dL — ABNORMAL LOW (ref 12.0–15.0)
Immature Granulocytes: 0 %
Lymphocytes Relative: 34 %
Lymphs Abs: 3.5 10*3/uL (ref 0.7–4.0)
MCH: 27.5 pg (ref 26.0–34.0)
MCHC: 30.6 g/dL (ref 30.0–36.0)
MCV: 90 fL (ref 80.0–100.0)
Monocytes Absolute: 0.6 10*3/uL (ref 0.1–1.0)
Monocytes Relative: 6 %
Neutro Abs: 5.6 10*3/uL (ref 1.7–7.7)
Neutrophils Relative %: 56 %
Platelets: 288 10*3/uL (ref 150–400)
RBC: 4.18 MIL/uL (ref 3.87–5.11)
RDW: 13.7 % (ref 11.5–15.5)
WBC: 10.2 10*3/uL (ref 4.0–10.5)
nRBC: 0 % (ref 0.0–0.2)

## 2024-08-25 LAB — COMPREHENSIVE METABOLIC PANEL WITH GFR
ALT: 34 U/L (ref 0–44)
AST: 37 U/L (ref 15–41)
Albumin: 4.2 g/dL (ref 3.5–5.0)
Alkaline Phosphatase: 172 U/L — ABNORMAL HIGH (ref 38–126)
Anion gap: 14 (ref 5–15)
BUN: 6 mg/dL (ref 6–20)
CO2: 23 mmol/L (ref 22–32)
Calcium: 9.5 mg/dL (ref 8.9–10.3)
Chloride: 107 mmol/L (ref 98–111)
Creatinine, Ser: 0.82 mg/dL (ref 0.44–1.00)
GFR, Estimated: >60 mL/min
Glucose, Bld: 108 mg/dL — ABNORMAL HIGH (ref 70–99)
Potassium: 4.2 mmol/L (ref 3.5–5.1)
Sodium: 144 mmol/L (ref 135–145)
Total Bilirubin: 0.4 mg/dL (ref 0.0–1.2)
Total Protein: 7.5 g/dL (ref 6.5–8.1)

## 2024-08-25 LAB — URINE DRUG SCREEN
Amphetamines: NEGATIVE
Barbiturates: NEGATIVE
Benzodiazepines: POSITIVE — AB
Cocaine: NEGATIVE
Fentanyl: NEGATIVE
Methadone Scn, Ur: NEGATIVE
Opiates: NEGATIVE
Tetrahydrocannabinol: NEGATIVE

## 2024-08-25 LAB — ETHANOL: Alcohol, Ethyl (B): <15 mg/dL

## 2024-08-25 LAB — URINALYSIS, ROUTINE W REFLEX MICROSCOPIC
Bilirubin Urine: NEGATIVE
Glucose, UA: NEGATIVE mg/dL
Hgb urine dipstick: NEGATIVE
Ketones, ur: NEGATIVE mg/dL
Leukocytes,Ua: NEGATIVE
Nitrite: NEGATIVE
Protein, ur: NEGATIVE mg/dL
Specific Gravity, Urine: 1.004 — ABNORMAL LOW (ref 1.005–1.030)
pH: 6 (ref 5.0–8.0)

## 2024-08-25 LAB — ACETAMINOPHEN LEVEL: Acetaminophen (Tylenol), Serum: <10 ug/mL — ABNORMAL LOW (ref 10–30)

## 2024-08-25 LAB — AMMONIA: Ammonia: 21 umol/L (ref 9–35)

## 2024-08-25 LAB — SALICYLATE LEVEL: Salicylate Lvl: <7 mg/dL — ABNORMAL LOW (ref 7.0–30.0)

## 2024-08-25 MED ORDER — GABAPENTIN 300 MG PO CAPS
600.0000 mg | ORAL_CAPSULE | Freq: Three times a day (TID) | ORAL | Status: DC
  Administered 2024-08-25: 600 mg via ORAL
  Filled 2024-08-25: qty 2

## 2024-08-25 MED ORDER — DULOXETINE HCL 30 MG PO CPEP
60.0000 mg | ORAL_CAPSULE | Freq: Every day | ORAL | Status: DC
  Administered 2024-08-25: 60 mg via ORAL
  Filled 2024-08-25: qty 2

## 2024-08-25 MED ORDER — TRAZODONE HCL 50 MG PO TABS: 50.0000 mg | ORAL_TABLET | Freq: Every evening | ORAL | Status: DC | PRN

## 2024-08-25 MED ORDER — ALPRAZOLAM 0.5 MG PO TABS
1.0000 mg | ORAL_TABLET | Freq: Three times a day (TID) | ORAL | Status: DC
  Administered 2024-08-25: 1 mg via ORAL
  Filled 2024-08-25: qty 2

## 2024-08-25 NOTE — ED Notes (Signed)
 Pt is consulting with tts.

## 2024-08-25 NOTE — ED Provider Notes (Signed)
 "  EMERGENCY DEPARTMENT AT [facility] Provider Note   CSN: [id number] Arrival date & time: 08/25/24  1637     Patient presents with: Suicidal   Gina Cunningham is a 54 y.o. female.   Patient is a 54 year old female with a past medical history of depression, anxiety, bipolar 1 who presents to the emergency department secondary to increased depression over the past week as well as suicidal thoughts with plan to overdose.  Patient notes that she has not done anything to harm herself as of yet.  She denies any homicidal ideation.  She notes that depression became worse after her diagnosis of cirrhosis back in December.  Patient has had no associated hallucinations or delusions.  Patient has no physical complaints at this time to include fever, chills, chest pain, shortness of breath, abdominal pain, nausea, vomiting, diarrhea.  She notes that she has been compliant with all her home medications.        Prior to Admission medications  Medication Sig Start Date End Date Taking? Authorizing Provider  albuterol  (PROVENTIL ) (2.5 MG/3ML) 0.083% nebulizer solution Take 3 mLs (2.5 mg total) by nebulization every 6 (six) hours as needed for wheezing or shortness of breath. 07/13/18   Triplett, Tammy, PA-C  ALPRAZolam  (XANAX ) 1 MG tablet Take 1 tablet by mouth 3 (three) times daily. 06/11/20   [provider]  ARIPiprazole (ABILIFY) 5 MG tablet Take 1 tablet by mouth daily. 06/18/23 06/17/24  [provider]  beclomethasone (QVAR ) 40 MCG/ACT inhaler Inhale 1 puff into the lungs daily as needed. 11/21/13   [provider]  cyclobenzaprine  (FLEXERIL ) 10 MG tablet Take by mouth. 05/30/22 09/19/24  [provider]  DULoxetine  (CYMBALTA ) 60 MG capsule Take 1 capsule by mouth in the morning and at bedtime. 12/02/19 12/01/20  [provider]  gabapentin  (NEURONTIN ) 600 MG tablet Take 1 tablet by mouth 3 (three) times daily. 12/02/19 12/01/20  [provider]  levalbuterol (XOPENEX HFA) 45 MCG/ACT inhaler Inhale into the lungs.    [provider]  naproxen  (NAPROSYN ) 500 MG tablet Take 1 tablet (500 mg total) by mouth 2 (two) times daily as needed for moderate pain. 07/06/20   Petrucelli, Samantha R, PA-C  phentermine 37.5 MG capsule Take 1 capsule by mouth daily. 05/13/20 11/09/20  [provider]  predniSONE  (STERAPRED UNI-PAK 21 TAB) 10 MG (21) TBPK tablet Take by mouth daily. Take 6 tabs by mouth daily  for 2 days, then 5 tabs for 2 days, then 4 tabs for 2 days, then 3 tabs for 2 days, 2 tabs for 2 days, then 1 tab by mouth daily for 2 days 11/28/22   Silver Wonda LABOR, PA  valACYclovir  (VALTREX ) 500 MG tablet Take 500 mg by mouth 2 (two) times daily.    [provider]  vitamin B-12 (CYANOCOBALAMIN ) 100 MCG tablet Take 100 mcg by mouth daily.    [provider]    Allergies: Penicillins    Review of Systems  Psychiatric/Behavioral:         Depression, suicidal ideation  All other systems reviewed and are negative.   Updated Vital Signs BP (!) 164/109 (BP Location: Right Arm)   Pulse 88   Temp 98.2 F (36.8 C) (Oral)   Resp (!) 24   Ht 5' 4 (1.626 m)   Wt 117.9 kg   SpO2 98%   BMI 44.63 kg/m   Physical Exam Vitals and nursing note reviewed.  Constitutional:  General: She is not in acute distress.    Appearance: Normal appearance. She is not ill-appearing.  HENT:     Head: Normocephalic and atraumatic.     Nose: Nose normal.     Mouth/Throat:     Mouth: Mucous membranes are moist.  Eyes:     Extraocular Movements: Extraocular movements intact.     Conjunctiva/sclera: Conjunctivae normal.     Pupils: Pupils are equal, round, and reactive to light.  Cardiovascular:     Rate and Rhythm: Normal rate and regular rhythm.     Pulses: Normal pulses.     Heart sounds: Normal heart sounds. No murmur heard.    No gallop.  Pulmonary:     Effort: Pulmonary effort is normal. No  respiratory distress.     Breath sounds: Normal breath sounds. No stridor. No wheezing, rhonchi or rales.  Abdominal:     General: Abdomen is flat. Bowel sounds are normal. There is no distension.     Palpations: Abdomen is soft.     Tenderness: There is no abdominal tenderness. There is no guarding.  Musculoskeletal:        General: Normal range of motion.     Cervical back: Normal range of motion and neck supple. No rigidity or tenderness.  Skin:    General: Skin is warm and dry.  Neurological:     General: No focal deficit present.     Mental Status: She is alert and oriented to person, place, and time. Mental status is at baseline.     Cranial Nerves: No cranial nerve deficit.     Sensory: No sensory deficit.     Motor: No weakness.     Coordination: Coordination normal.     Gait: Gait normal.  Psychiatric:        Attention and Perception: Attention and perception normal.        Mood and Affect: Mood is anxious and depressed. Mood is not elated. Affect is tearful. Affect is not labile, blunt, flat, angry or inappropriate.        Speech: Speech normal.        Behavior: Behavior normal. Behavior is cooperative.        Thought Content: Thought content is not paranoid or delusional. Thought content includes suicidal ideation. Thought content does not include homicidal ideation. Thought content includes suicidal plan. Thought content does not include homicidal plan.        Cognition and Memory: Cognition and memory normal.        Judgment: Judgment is not impulsive or inappropriate.     (all labs ordered are listed, but only abnormal results are displayed) Labs Reviewed  COMPREHENSIVE METABOLIC PANEL WITH GFR  ETHANOL  URINE DRUG SCREEN  CBC WITH DIFFERENTIAL/PLATELET  SALICYLATE LEVEL  ACETAMINOPHEN  LEVEL  URINALYSIS, ROUTINE W REFLEX MICROSCOPIC    EKG: None  Radiology: No results found.   Procedures   Medications Ordered in the ED - No data to display  Clinical  Course as of 08/25/24 2214  Mon Aug 25, 2024  2113 Patient did state to the nursing staff that she wants to leave at this time as she states that she is no longer suicidal.  Did discuss with the patient that she has been evaluated by psychiatry and is recommended inpatient admission.  She does state that she continues to want to leave at this point.  IVC paperwork has been completed. [CR]    Clinical Course User Index [CR] Daralene Lonni BIRCH, PA-C  Medical Decision Making Patient is medically clear for TTS evaluation at this time.  Patient has been evaluated by TTS who is recommending inpatient admission.  I was notified by nursing staff that patient wanted to leave as she states that she is no longer suicidal and does not want to wait.  IVC paperwork has been initiated given patient's initial comments about suicidal ideation and her previous attempts.  Do not suspect that she is stable for discharge home at this time.  Amount and/or Complexity of Data Reviewed Labs: ordered.        Final diagnoses:  None    ED Discharge Orders     None          Daralene Lonni JONETTA DEVONNA 08/25/24 2215    Dean Clarity, MD 08/25/24 2226  "

## 2024-08-25 NOTE — BH Assessment (Signed)
 Hi, TTS Consult will be deferred to IRIS. IRIS Coordinator will provide a time for the assessment. Secure chat initiated with patient's care team.

## 2024-08-25 NOTE — ED Notes (Signed)
 Pt saying she wants to go home, says she is tired of sitting here and not suicidal anymore! PA made aware and at bedside talking with pt

## 2024-08-25 NOTE — ED Triage Notes (Addendum)
 Pt arrives via POV c/o being depressed starting on Thursday. Pt states, Family and everyone would be better off if I wasn't here anymore. I'm tired of living. This seems to have started after developing Cirrhosis in December. Pt states, I just want to give up. Pt has had previous suicide attempts years ago.

## 2024-08-25 NOTE — ED Notes (Signed)
 Pt belongings placed in bag consisting of: Pants. shirt, sweatshirt, bra, clogs, 2 necklaces

## 2024-08-26 ENCOUNTER — Encounter (HOSPITAL_COMMUNITY): Payer: Self-pay | Admitting: Psychiatry

## 2024-08-26 ENCOUNTER — Inpatient Hospital Stay (HOSPITAL_COMMUNITY): Admission: AD | Admit: 2024-08-26 | Source: Intra-hospital | Admitting: Psychiatry

## 2024-08-26 DIAGNOSIS — F332 Major depressive disorder, recurrent severe without psychotic features: Principal | ICD-10-CM | POA: Diagnosis present

## 2024-08-26 DIAGNOSIS — F3130 Bipolar disorder, current episode depressed, mild or moderate severity, unspecified: Secondary | ICD-10-CM | POA: Diagnosis present

## 2024-08-26 DIAGNOSIS — F411 Generalized anxiety disorder: Secondary | ICD-10-CM | POA: Diagnosis present

## 2024-08-26 MED ORDER — DIPHENHYDRAMINE HCL 25 MG PO CAPS
50.0000 mg | ORAL_CAPSULE | Freq: Three times a day (TID) | ORAL | Status: AC | PRN
  Filled 2024-08-26: qty 2

## 2024-08-26 MED ORDER — TRAZODONE HCL 50 MG PO TABS
50.0000 mg | ORAL_TABLET | Freq: Every evening | ORAL | Status: AC | PRN
  Administered 2024-08-27 – 2024-08-29 (×3): 50 mg via ORAL
  Filled 2024-08-26 (×3): qty 1

## 2024-08-26 MED ORDER — MAGNESIUM HYDROXIDE 400 MG/5ML PO SUSP: 30.0000 mL | Freq: Every day | ORAL | Status: AC | PRN

## 2024-08-26 MED ORDER — DIPHENHYDRAMINE HCL 50 MG/ML IJ SOLN: 50.0000 mg | Freq: Three times a day (TID) | INTRAMUSCULAR | Status: AC | PRN

## 2024-08-26 MED ORDER — LORAZEPAM 2 MG/ML IJ SOLN: 2.0000 mg | Freq: Three times a day (TID) | INTRAMUSCULAR | Status: AC | PRN

## 2024-08-26 MED ORDER — ALPRAZOLAM 0.5 MG PO TABS
1.0000 mg | ORAL_TABLET | Freq: Two times a day (BID) | ORAL | Status: DC | PRN
  Administered 2024-08-26 – 2024-08-27 (×3): 1 mg via ORAL
  Filled 2024-08-26 (×3): qty 2

## 2024-08-26 MED ORDER — HALOPERIDOL LACTATE 5 MG/ML IJ SOLN: 10.0000 mg | Freq: Three times a day (TID) | INTRAMUSCULAR | Status: AC | PRN

## 2024-08-26 MED ORDER — HALOPERIDOL LACTATE 5 MG/ML IJ SOLN: 5.0000 mg | Freq: Three times a day (TID) | INTRAMUSCULAR | Status: AC | PRN

## 2024-08-26 MED ORDER — HALOPERIDOL 5 MG PO TABS: 5.0000 mg | ORAL_TABLET | Freq: Three times a day (TID) | ORAL | Status: AC | PRN

## 2024-08-26 NOTE — Group Note (Signed)
 Date:  08/26/2024 Time:  10:31 PM  Group Topic/Focus:  Wrap-Up Group:   The focus of this group is to help patients review their daily goal of treatment and discuss progress on daily workbooks.    Participation Level:  Active  Participation Quality:  Appropriate and Sharing  Affect:  Appropriate, Flat, and Tearful  Cognitive:  Appropriate  Insight: Appropriate  Engagement in Group:  Engaged  Modes of Intervention:  Activity and Socialization  Additional Comments:  Patients completed wrap up group sheets and shared. Patient stated that he is feeling, just ok, my first day here. Patient rated her day a 1, worst day ever. First time I have ever been handcuffed and shackled. High point from patient day, seeing my daughter. Low point from patient day, having to say good bye.   Eward Mace 08/26/2024, 10:31 PM

## 2024-08-26 NOTE — Progress Notes (Signed)
 Pt has been accepted to [facility] on 08/26/2024 Bed assignment: 301-01  Pt meets inpatient criteria per: Cathaleen Jacobson NP  Attending Physician will be: Dr. Prentis    Report can be called to: Adult unit: [phone]  Pt can arrive after RN WILL UPDATE   Care Team Notified: [facility] McNichol RN, [place] Mangrum NP  [place] Nhung Danko LCSW  08/26/2024 9:10 AM

## 2024-08-26 NOTE — Group Note (Signed)
 Date:  08/26/2024 Time:  1:41 PM  Group Topic/Focus: Mental Health Video Emotional Education:   The focus of this group is to discuss what feelings/emotions are, and how they are experienced.    Participation Level:  Did Not Attend  Participation Quality:  NA  Affect:  NA  Cognitive:  NA  Insight: NA  Engagement in Group:  NA  Modes of Intervention:  NA  Additional Comments:  Patient did not attend group.  Gina Cunningham Gina Cunningham 08/26/2024, 1:41 PM

## 2024-08-26 NOTE — Group Note (Signed)
 LCSW Group Therapy Note   Group Date: 08/26/2024 Start Time: 1100 End Time: 1200   Participation:  did not attend  Type of Therapy and Topic:  Group Therapy  Topic:  Healing From Within: Understanding Our Past, Building Our Future.  Objective:  To help participants understand the impact of early experiences on mental and physical health, with a focus on Adverse Childhood Experiences (ACEs), and to explore ways to build resilience and healing.  Group Goals: Understand ACEs and Their Impact: Learn how childhood experiences shape mental and physical health. Build Resilience: Develop strategies for overcoming challenges and creating positive change. Promote Healing: Recognize the value of support and the possibility of healing through therapy and self-care.  Summary: In today's session, we discussed how early experiences, especially ACEs, impact mental and physical health. We explored the effects of stress, abuse, and neglect on brain development and well-being. The group focused on resilience, understanding that healing and positive change are possible with support and self-awareness.  Therapeutic Modalities:   Psychoeducation: Sharing information about ACEs and their effects. Cognitive Behavioral Therapy (CBT): Helping reframe negative thought patterns. Trauma-Informed Therapy: Creating a safe, supportive space for healing.   Tavone Caesar O Voyd Groft, LCSW 08/26/2024  12:12 PM

## 2024-08-26 NOTE — Progress Notes (Signed)
 Pt was brought to the facility by police officer in paper scrubs. She was sullen and said that she was triggered by her husband buying a bag with a pentagon on it. She destroyed the bag which was followed by an argument with her husband causing her even more distress. She said that she feels like giving up on life but doesn't have a plan for suicide. She has two previous SA; One by cutting her right wrist and the other by OD. Last attempt was on 07/2013. She is requesting a visit by a Chaplain so one was ordered. She does attend a church service weekly. She feels lied to by the previous doctor in the ED and by the police. She nearly had a panic attack when brought to the unit. She was given a PRN which greatly helped her out. She attended group and mixed with peers well. She uses a Bipap which she said that she will ask her daughter to bring in for her to use. She had gastric bypass surgery last December and at that time she was diagnosed with cirrhosis of the liver which she is struggling with that diagnosis since she doesn't drink alcohol.

## 2024-08-26 NOTE — Plan of Care (Signed)
 Pt was out in the milieu during the day acting appropriately. Went to [employer] and ate adequately. Attending group activity and participated. Denies SI/HI/SH/paranoia/AVH. Will continue to monitor.

## 2024-08-26 NOTE — Group Note (Signed)
 Date:  08/26/2024 Time:  6:44 PM  Group Topic/Focus:  Making Healthy Choices:   The focus of this group is to help patients identify negative/unhealthy choices they were using prior to admission and identify positive/healthier coping strategies to replace them upon discharge. Taught actions that lead to a healthy brain.    Participation Level:  Active  Participation Quality:  Attentive  Affect:  Appropriate  Cognitive:  Appropriate  Insight: Appropriate  Engagement in Group:  Engaged  Modes of Intervention:  Discussion and Education  Additional Comments:    Gina Cunningham 08/26/2024, 6:44 PM

## 2024-08-27 ENCOUNTER — Encounter (HOSPITAL_COMMUNITY): Payer: Self-pay

## 2024-08-27 MED ORDER — BECLOMETHASONE DIPROPIONATE 40 MCG/ACT IN AERS: 1.0000 | INHALATION_SPRAY | Freq: Every day | RESPIRATORY_TRACT | Status: DC | PRN

## 2024-08-27 MED ORDER — ALBUTEROL SULFATE HFA 108 (90 BASE) MCG/ACT IN AERS: 1.0000 | INHALATION_SPRAY | Freq: Four times a day (QID) | RESPIRATORY_TRACT | Status: AC | PRN

## 2024-08-27 MED ORDER — GABAPENTIN 300 MG PO CAPS
600.0000 mg | ORAL_CAPSULE | Freq: Three times a day (TID) | ORAL | Status: AC
  Administered 2024-08-27 – 2024-08-29 (×8): 600 mg via ORAL
  Filled 2024-08-27 (×8): qty 2

## 2024-08-27 MED ORDER — VITAMIN D 25 MCG (1000 UNIT) PO TABS
1000.0000 [IU] | ORAL_TABLET | Freq: Every day | ORAL | Status: AC
  Administered 2024-08-27 – 2024-08-29 (×3): 1000 [IU] via ORAL
  Filled 2024-08-27 (×3): qty 1

## 2024-08-27 MED ORDER — HYDROXYZINE HCL 25 MG PO TABS
25.0000 mg | ORAL_TABLET | Freq: Three times a day (TID) | ORAL | Status: AC | PRN
  Administered 2024-08-27 – 2024-08-29 (×3): 25 mg via ORAL
  Filled 2024-08-27 (×3): qty 1

## 2024-08-27 MED ORDER — VALACYCLOVIR HCL 500 MG PO TABS
500.0000 mg | ORAL_TABLET | Freq: Two times a day (BID) | ORAL | Status: AC
  Administered 2024-08-27 – 2024-08-29 (×5): 500 mg via ORAL
  Filled 2024-08-27 (×5): qty 1

## 2024-08-27 MED ORDER — PANTOPRAZOLE SODIUM 40 MG PO TBEC
40.0000 mg | DELAYED_RELEASE_TABLET | Freq: Every day | ORAL | Status: AC
  Administered 2024-08-27 – 2024-08-29 (×3): 40 mg via ORAL
  Filled 2024-08-27 (×3): qty 1

## 2024-08-27 MED ORDER — BUDESONIDE 0.25 MG/2ML IN SUSP: 0.2500 mg | Freq: Every day | RESPIRATORY_TRACT | Status: DC | PRN

## 2024-08-27 MED ORDER — VITAMIN B-12 100 MCG PO TABS
100.0000 ug | ORAL_TABLET | Freq: Every day | ORAL | Status: AC
  Administered 2024-08-27 – 2024-08-29 (×3): 100 ug via ORAL
  Filled 2024-08-27 (×3): qty 1

## 2024-08-27 MED ORDER — TAB-A-VITE/IRON PO TABS
1.0000 | ORAL_TABLET | Freq: Every day | ORAL | Status: AC
  Administered 2024-08-27 – 2024-08-29 (×3): 1 via ORAL
  Filled 2024-08-27 (×3): qty 1

## 2024-08-27 MED ORDER — DULOXETINE HCL 60 MG PO CPEP
60.0000 mg | ORAL_CAPSULE | Freq: Every day | ORAL | Status: AC
  Administered 2024-08-27 – 2024-08-29 (×3): 60 mg via ORAL
  Filled 2024-08-27 (×3): qty 1

## 2024-08-27 MED ORDER — CYCLOBENZAPRINE HCL 10 MG PO TABS
10.0000 mg | ORAL_TABLET | Freq: Three times a day (TID) | ORAL | Status: AC
  Administered 2024-08-27 – 2024-08-29 (×8): 10 mg via ORAL
  Filled 2024-08-27 (×8): qty 1

## 2024-08-27 NOTE — Plan of Care (Signed)
   Problem: Activity: Goal: Interest or engagement in activities will improve Outcome: Progressing

## 2024-08-27 NOTE — Group Note (Signed)
 Date:  08/27/2024 Time:  3:46 PM  Group Topic/Focus: Write a letter to your past or future self Emotional Education:   The focus of this group is to discuss what feelings/emotions are, and how they are experienced.    Participation Level:  Active  Participation Quality:  Appropriate  Affect:  Appropriate  Cognitive:  Appropriate  Insight: Appropriate  Engagement in Group:  Engaged  Modes of Intervention:  Discussion  Additional Comments:  Patient engaged appropriately during group.   Starlena Beil D Kimiyah Blick 08/27/2024, 3:46 PM

## 2024-08-27 NOTE — [facility] Group Notes (Signed)
 [facility] Group Notes:  (Nursing/MHT/Case Management/Adjunct)  Date:  08/27/2024  Time:  9:13 PM  Type of Therapy:  Group Therapy  Participation Level:  Minimal  Participation Quality:  Attentive  Affect:  Appropriate  Cognitive:  Appropriate  Insight:  Appropriate  Engagement in Group:  Developing/Improving  Modes of Intervention:  Education  Summary of Progress/Problems: Patient attended the evening N.A. meeting and was appropriate.   Gina Cunningham S 08/27/2024, 9:13 PM

## 2024-08-27 NOTE — BH IP Treatment Plan (Signed)
 Interdisciplinary Treatment and Diagnostic Plan Update  08/27/2024 Time of Session: 1005 Gina Cunningham MRN: [id number]  Principal Diagnosis: MDD (major depressive disorder), recurrent severe, without psychosis (HCC)  Secondary Diagnoses: Principal Problem:   MDD (major depressive disorder), recurrent severe, without psychosis (HCC)   Current Medications:  Current Facility-Administered Medications  Medication Dose Route Frequency Provider Last Rate Last Admin   ALPRAZolam  (XANAX ) tablet 1 mg  1 mg Oral BID PRN Prentis Kitchens A, DO   1 mg at 08/27/24 0802   haloperidol  (HALDOL ) tablet 5 mg  5 mg Oral TID PRN Onuoha, Chinwendu V, NP       And   diphenhydrAMINE  (BENADRYL ) capsule 50 mg  50 mg Oral TID PRN Onuoha, Chinwendu V, NP       haloperidol  lactate (HALDOL ) injection 5 mg  5 mg Intramuscular TID PRN Onuoha, Chinwendu V, NP       And   diphenhydrAMINE  (BENADRYL ) injection 50 mg  50 mg Intramuscular TID PRN Onuoha, Chinwendu V, NP       And   LORazepam  (ATIVAN ) injection 2 mg  2 mg Intramuscular TID PRN Onuoha, Chinwendu V, NP       haloperidol  lactate (HALDOL ) injection 10 mg  10 mg Intramuscular TID PRN Onuoha, Chinwendu V, NP       And   diphenhydrAMINE  (BENADRYL ) injection 50 mg  50 mg Intramuscular TID PRN Onuoha, Chinwendu V, NP       And   LORazepam  (ATIVAN ) injection 2 mg  2 mg Intramuscular TID PRN Onuoha, Chinwendu V, NP       magnesium  hydroxide (MILK OF MAGNESIA) suspension 30 mL  30 mL Oral Daily PRN Onuoha, Chinwendu V, NP       traZODone  (DESYREL ) tablet 50 mg  50 mg Oral QHS PRN Onuoha, Chinwendu V, NP       PTA Medications: Medications Prior to Admission  Medication Sig Dispense Refill Last Dose/Taking   ascorbic acid (VITAMIN C) 500 MG tablet Take 500 mg by mouth daily.   Taking   calcium citrate (CALCITRATE - DOSED IN MG ELEMENTAL CALCIUM) 950 (200 Ca) MG tablet Take 200 mg of elemental calcium by mouth daily.   Taking   cholecalciferol  (VITAMIN D3) 25 MCG (1000  UNIT) tablet Take 1,000 Units by mouth daily.   Taking   ferrous sulfate 325 (65 FE) MG EC tablet Take 325 mg by mouth 3 (three) times daily with meals.   Taking   Multiple Vitamins-Iron  (MULTIVITAMINS WITH IRON ) TABS tablet Take 1 tablet by mouth daily.   Taking   omeprazole (PRILOSEC) 20 MG capsule Take 20 mg by mouth daily.   Taking   ALPRAZolam  (XANAX ) 1 MG tablet Take 1 tablet by mouth 3 (three) times daily.      beclomethasone (QVAR ) 40 MCG/ACT inhaler Inhale 1 puff into the lungs daily as needed.      cyclobenzaprine  (FLEXERIL ) 10 MG tablet Take by mouth.      DULoxetine  (CYMBALTA ) 60 MG capsule Take 1 capsule by mouth in the morning and at bedtime.      gabapentin  (NEURONTIN ) 600 MG tablet Take 1 tablet by mouth 3 (three) times daily.      valACYclovir  (VALTREX ) 500 MG tablet Take 500 mg by mouth 2 (two) times daily.      vitamin B-12 (CYANOCOBALAMIN ) 100 MCG tablet Take 100 mcg by mouth daily.       Patient Stressors:    Patient Strengths:    Treatment Modalities: Medication Management, Group  therapy, Case management,  1 to 1 session with clinician, Psychoeducation, Recreational therapy.   Physician Treatment Plan for Primary Diagnosis: MDD (major depressive disorder), recurrent severe, without psychosis (HCC) Long Term Goal(s): Improvement in symptoms so as ready for discharge   Short Term Goals: Ability to identify and develop effective coping behaviors will improve Ability to maintain clinical measurements within normal limits will improve Compliance with prescribed medications will improve Ability to identify triggers associated with substance abuse/mental health issues will improve Ability to identify changes in lifestyle to reduce recurrence of condition will improve Ability to verbalize feelings will improve Ability to disclose and discuss suicidal ideas Ability to demonstrate self-control will improve  Medication Management: Evaluate patient's response, side effects,  and tolerance of medication regimen.  Therapeutic Interventions: 1 to 1 sessions, Unit Group sessions and Medication administration.  Evaluation of Outcomes: Not Progressing  Physician Treatment Plan for Secondary Diagnosis: Principal Problem:   MDD (major depressive disorder), recurrent severe, without psychosis (HCC)  Long Term Goal(s): Improvement in symptoms so as ready for discharge   Short Term Goals: Ability to identify and develop effective coping behaviors will improve Ability to maintain clinical measurements within normal limits will improve Compliance with prescribed medications will improve Ability to identify triggers associated with substance abuse/mental health issues will improve Ability to identify changes in lifestyle to reduce recurrence of condition will improve Ability to verbalize feelings will improve Ability to disclose and discuss suicidal ideas Ability to demonstrate self-control will improve     Medication Management: Evaluate patient's response, side effects, and tolerance of medication regimen.  Therapeutic Interventions: 1 to 1 sessions, Unit Group sessions and Medication administration.  Evaluation of Outcomes: Not Progressing   RN Treatment Plan for Primary Diagnosis: MDD (major depressive disorder), recurrent severe, without psychosis (HCC) Long Term Goal(s): Knowledge of disease and therapeutic regimen to maintain health will improve  Short Term Goals: Ability to remain free from injury will improve, Ability to verbalize frustration and anger appropriately will improve, Ability to demonstrate self-control, Ability to participate in decision making will improve, Ability to verbalize feelings will improve, Ability to disclose and discuss suicidal ideas, Ability to identify and develop effective coping behaviors will improve, and Compliance with prescribed medications will improve  Medication Management: RN will administer medications as ordered by  provider, will assess and evaluate patient's response and provide education to patient for prescribed medication. RN will report any adverse and/or side effects to prescribing provider.  Therapeutic Interventions: 1 on 1 counseling sessions, Psychoeducation, Medication administration, Evaluate responses to treatment, Monitor vital signs and CBGs as ordered, Perform/monitor CIWA, COWS, AIMS and Fall Risk screenings as ordered, Perform wound care treatments as ordered.  Evaluation of Outcomes: Not Progressing   LCSW Treatment Plan for Primary Diagnosis: MDD (major depressive disorder), recurrent severe, without psychosis (HCC) Long Term Goal(s): Safe transition to appropriate next level of care at discharge, Engage patient in therapeutic group addressing interpersonal concerns.  Short Term Goals: Engage patient in aftercare planning with referrals and resources, Increase social support, Increase ability to appropriately verbalize feelings, Increase emotional regulation, Facilitate acceptance of mental health diagnosis and concerns, Facilitate patient progression through stages of change regarding substance use diagnoses and concerns, Identify triggers associated with mental health/substance abuse issues, and Increase skills for wellness and recovery  Therapeutic Interventions: Assess for all discharge needs, 1 to 1 time with Social worker, Explore available resources and support systems, Assess for adequacy in community support network, Educate family and significant other(s) on suicide  prevention, Complete Psychosocial Assessment, Interpersonal group therapy.  Evaluation of Outcomes: Not Progressing   Progress in Treatment: Attending groups: No. Participating in groups: No. Taking medication as prescribed: Yes. Toleration medication: Yes. Family/Significant other contact made: No, will contact:  Consents pending. Patient understands diagnosis: Yes. Discussing patient identified problems/goals  with staff: Yes. Medical problems stabilized or resolved: No. Denies suicidal/homicidal ideation: Yes. Issues/concerns per patient self-inventory: No.   New problem(s) identified: No, Describe:  None  New Short Term/Long Term Goal(s): medication stabilization, elimination of SI thoughts, development of comprehensive mental wellness plan.    Patient Goals:  Pt would like to have medications restarted, improve communication skills with husband and gain new coping strategies.  Discharge Plan or Barriers: Patient recently admitted. CSW will continue to follow and assess for appropriate referrals and possible discharge planning.    Reason for Continuation of Hospitalization: Medication stabilization Suicidal ideation  Estimated Length of Stay: 5-7 days  Last 3 [place] Suicide Severity Risk Score: Flowsheet Row Ad[facility] (Current) [facility] from 08/25/2024 in [facility] at [facility] from 10/13/2023 in [facility] RISK CATEGORY High Risk High Risk No Risk    Last PHQ 2/9 Scores:     No data to display          Scribe for Treatment Team: Derick JONELLE Blanch, LCSW 08/27/2024 11:46 AM

## 2024-08-27 NOTE — Plan of Care (Signed)
   Problem: Education: Goal: Knowledge of [place] [facility] Education information/materials will improve Outcome: Progressing Goal: Emotional status will improve Outcome: Progressing Goal: Mental status will improve Outcome: Progressing Goal: Verbalization of understanding the information provided will improve Outcome: Progressing

## 2024-08-27 NOTE — Progress Notes (Signed)
" °   08/27/24 1600  Psych Admission Type (Psych Patients Only)  Admission Status Voluntary  Psychosocial Assessment  Patient Complaints Worrying;Anxiety  Eye Contact Fair  Facial Expression Sad  Affect Sad  Speech Logical/coherent  Interaction Assertive  Motor Activity Slow  Appearance/Hygiene Unremarkable  Behavior Characteristics Anxious  Mood Anxious;Sad  Thought Process  Coherency WDL  Content Blaming others;Religiosity  Delusions None reported or observed  Perception WDL  Hallucination None reported or observed  Judgment Impaired  Confusion None  Danger to Self  Current suicidal ideation? Denies  Danger to Others  Danger to Others None reported or observed    "
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
## 2024-08-27 NOTE — Group Note (Signed)
 Date:  08/27/2024 Time:  9:21 AM  Group Topic/Focus:  Goals Group:   The focus of this group is to help patients establish daily goals to achieve during treatment and discuss how the patient can incorporate goal setting into their daily lives to aide in recovery.    Participation Level:  Active  Participation Quality:  Appropriate  Affect:  Appropriate  Cognitive:  Appropriate  Insight: Appropriate  Engagement in Group:  Engaged  Modes of Intervention:  Activity  Additional Comments:    Dangela How 08/27/2024, 9:21 AM

## 2024-08-27 NOTE — Progress Notes (Signed)
 Pt complaining of right flank pain along with urinary urgency, frequency, and pain that begin 2 days ago.  Patient also concerned about non movable mass in right flank.

## 2024-08-27 NOTE — Group Note (Signed)
 Recreation Therapy Group Note   Group Topic:Communication  Group Date: 08/27/2024 Start Time: 0930 End Time: 1000 Facilitators: Atzel Mccambridge-McCall, LRT,CTRS Location: [address] Day[address]   Group Topic: Communication, Team Building, Problem Solving  Goal Area(s) Addresses:  Patient will effectively work with peer towards shared goal.  Patient will identify skills used to make activity successful.  Patient will identify how skills used during activity can be applied to reach post d/c goals.   Behavioral Response: Engaged  Intervention: STEM Activity- Glass Blower/designer  Activity: Tallest [employer]. In teams of 5-6, patients were given 11 craft pipe cleaners. Using the materials provided, patients were instructed to compete again the opposing team(s) to build the tallest free-standing structure from floor level. The activity was timed; difficulty increased by clinical research associate as production designer, theatre/television/film continued.  Systematically resources were removed with additional directions for example, placing one arm behind their back, working in silence, and shape stipulations. LRT facilitated post-activity discussion reviewing team processes and necessary communication skills involved in completion. Patients were encouraged to reflect how the skills utilized, or not utilized, in this activity can be incorporated to positively impact support systems post discharge.  Education: Pharmacist, Community, Scientist, Physiological, Discharge Planning   Education Outcome: Acknowledges education/In group clarification offered/Needs additional education.    Affect/Mood: Appropriate   Participation Level: Engaged   Participation Quality: Independent   Behavior: Appropriate   Speech/Thought Process: Focused   Insight: Good   Judgement: Good   Modes of Intervention: STEM Activity   Patient Response to Interventions:  Engaged   Education Outcome:  In group clarification offered    Clinical  Observations/Individualized Feedback: Pt was engaged and focused. Pt worked well with peers in coming up with ideas and putting those ideas into action. Pt showed some skepticism during activity when the obstacles were added. Pt was able to work through those doubts and be successful with peers.      Plan: Continue to engage patient in RT group sessions 2-3x/week.   Arnold Kester-McCall, LRT,CTRS  08/27/2024 12:22 PM
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
## 2024-08-28 MED ORDER — LORAZEPAM 1 MG PO TABS
1.0000 mg | ORAL_TABLET | Freq: Three times a day (TID) | ORAL | Status: AC | PRN
  Administered 2024-08-28 – 2024-08-29 (×3): 1 mg via ORAL
  Filled 2024-08-28 (×3): qty 1

## 2024-08-28 NOTE — B[facility] Group Notes (Signed)
 Patient did attend Social Work Group.

## 2024-08-28 NOTE — B[facility] Group Notes (Signed)
 Patient did attend Occupational Therapy Group.

## 2024-08-28 NOTE — Progress Notes (Signed)
" °   08/28/24 1000  Psych Admission Type (Psych Patients Only)  Admission Status Involuntary  Psychosocial Assessment  Patient Complaints Anxiety;Worrying  Eye Contact Fair  Facial Expression Flat  Affect Sad  Speech Logical/coherent  Interaction Assertive  Motor Activity Slow  Appearance/Hygiene Unremarkable  Behavior Characteristics Anxious  Mood Anxious;Sad  Thought Process  Coherency WDL  Content Blaming others  Delusions None reported or observed  Perception WDL  Hallucination None reported or observed  Judgment Impaired  Confusion None  Danger to Self  Current suicidal ideation? Denies  Description of Suicide Plan No Plan  Agreement Not to Harm Self Yes  Description of Agreement Verbal  Danger to Others  Danger to Others None reported or observed    "

## 2024-08-28 NOTE — Group Note (Addendum)
 Recreation Therapy Group Note   Group Topic:Other  Group Date: 08/28/2024 Start Time: 1303 End Time: 1347 Facilitators: Sayaka Hoeppner-McCall, LRT,CTRS Location: [address] Dayroom   Activity Description/Intervention: Therapeutic Drumming. Patients with peers and staff were given the opportunity to engage in a leader facilitated [facility] Group Empowerment Drumming Circle with staff from the [employer], in partnership with [employer]. Teaching laboratory technician and trained [employer], Norleen Mon leading with LRT observing and documenting intervention and pt response. This evidenced-based practice targets 7 areas of health and wellbeing in the human experience including: stress-reduction, exercise, self-expression, camaraderie/support, nurturing, spirituality, and music-making (leisure).   Goal Area(s) Addresses:  Patient will engage in pro-social way in music group.  Patient will follow directions of drum leader on the first prompt. Patient will demonstrate no behavioral issues during group.  Patient will identify if a reduction in stress level occurs as a result of participation in therapeutic drum circle.    Education: Leisure exposure, Pharmacologist, Musical expression, Discharge Planning  Affect/Mood: Appropriate   Participation Level: Engaged   Participation Quality: Independent   Behavior: Appropriate   Speech/Thought Process: Focused   Insight: Good   Judgement: Good   Modes of Intervention: Teaching Laboratory Technician   Patient Response to Interventions:  Engaged   Education Outcome:  In group clarification offered    Clinical Observations/Individualized Feedback: Gina Cunningham actively engaged in therapeutic drumming exercise and discussions. Pt was appropriate with peers, staff, and musical equipment for duration of programming.  Pt identified blessed as their feeling after participation in music-based programming. Pt affect congruent with verbalized emotion.     Plan: Continue to engage patient in RT group sessions 2-3x/week.   Eldred Lievanos-McCall, LRT,CTRS 08/28/2024 2:16 PM

## 2024-08-28 NOTE — Progress Notes (Signed)
" °   08/28/24 2200  Psych Admission Type (Psych Patients Only)  Admission Status Involuntary  Psychosocial Assessment  Patient Complaints Anxiety;Worrying  Eye Contact Fair  Facial Expression Flat  Affect Sad  Speech Logical/coherent  Interaction Assertive  Motor Activity Slow  Appearance/Hygiene Unremarkable  Behavior Characteristics Anxious  Mood Anxious;Sad  Thought Process  Coherency WDL  Content Blaming others  Delusions None reported or observed  Perception WDL  Hallucination None reported or observed  Judgment Impaired  Confusion None  Danger to Self  Current suicidal ideation? Denies  Agreement Not to Harm Self Yes  Description of Agreement Verbal  Danger to Others  Danger to Others None reported or observed    "

## 2024-08-28 NOTE — Group Note (Deleted)
 Date:  08/28/2024 Time:  2:37 PM  Group Topic/Focus: Drumming Group      Participation Level:  {BHH PARTICIPATION OZCZO:77735}  Participation Quality:  {BHH PARTICIPATION QUALITY:22265}  Affect:  {BHH AFFECT:22266}  Cognitive:  {BHH COGNITIVE:22267}  Insight: {BHH Insight2:20797}  Engagement in Group:  {BHH ENGAGEMENT IN HMNLE:77731}  Modes of Intervention:  {BHH MODES OF INTERVENTION:22269}  Additional Comments:  PIERRETTE Seashore D Emeli Goguen 08/28/2024, 2:37 PM

## 2024-08-28 NOTE — Progress Notes (Signed)
 [facility] MD Progress Note  08/28/2024 5:00 PM Gina Cunningham  MRN:  [id number]  Reason for admission: 54 year old Caucasian female with prior hx of major depressive disorder, generalized anxiety disorder, bipolar disorder & previous psychiatric admission/treatments at the [facility]). She is being admitted to the [facility] form the [facility] with complaints of worsening symptoms of depression & passive suicidal ideations without plans or intent to hurt herself. This was apparently triggered by patient learning that she has liver cirrhosis during her weight loss surgery. Patient has hx of suicide attempts by overdose on some medications & laceration of her writs.   Daily notes: Gina Cunningham is seen this morning. Chart reviewed. The chart findings discussed with the treatment team. She presents alert, oriented & ware of situation. She is visible on the unit, attending group sessions. She presents with an improving affect, good eye contact & verbally responsive. She reports, I doing okay today. My mood is improving. I do not feel weighted down today like I felt yesterday. Yesterday was very challenging for me. It was as emotional as well. I slept well last night. I have been up this morning had breakfast & attending group sessions. I have been talking in group & getting to listen to the other patients' struggles. I kind of realized that I'm not alone in the way I was feeling. My family are doing well. My daughter & her husband did make some arrangement for child care now that I'm not there. That made me feel better as well. I'm eating well & I'm drinking plenty of water. However, the diarrhea continues. This is expected because I was told prior to the bariatric surgery that I will have diarrhea afterwards. Gina Cunningham currently denies any SIHI, AVH, delusional thoughts or paranoia. She does not appear to be responding to any internal stimuli. Patient's Alprazolam  is discontinued, started on Lorazepam  on a prn  basis. Patient's blood pressure is a bit low today. This will be rechecked by the staff. Patient denies any distress. Continue current plan of care as already in progress. Will obtain TSH, lipid panel, hgba1c tomorrow morning.  Principal Problem: MDD (major depressive disorder), recurrent severe, without psychosis (HCC)  Diagnosis: Principal Problem:   MDD (major depressive disorder), recurrent severe, without psychosis (HCC) Active Problems:   Bipolar I disorder, most recent episode depressed (HCC)   GAD (generalized anxiety disorder)  Total Time spent with patient: 35 minutes/  Past Psychiatric History: See H&P.  Past Medical History:  Past Medical History:  Diagnosis Date   Anxiety    Asthma    Bipolar 1 disorder (HCC)    Depression    Fibromyalgia    Sciatica     Past Surgical History:  Procedure Laterality Date   ABDOMINAL HYSTERECTOMY     APPENDECTOMY     CESAREAN SECTION     CHOLECYSTECTOMY     Family History: History reviewed. No pertinent family history.  Family Psychiatric  History: See H&P.  Social History:  Social History   Substance and Sexual Activity  Alcohol Use Not Currently     Social History   Substance and Sexual Activity  Drug Use Not Currently    Social History   Socioeconomic History   Marital status: Married    Spouse name: Not on file   Number of children: Not on file   Years of education: Not on file   Highest education level: Not on file  Occupational History   Not on file  Tobacco  Use   Smoking status: Former    Current packs/day: 1.00    Types: Cigarettes   Smokeless tobacco: Never  Vaping Use   Vaping status: Some Days  Substance and Sexual Activity   Alcohol use: Not Currently   Drug use: Not Currently   Sexual activity: Not on file  Other Topics Concern   Not on file  Social History Narrative   Not on file   Social Drivers of Health   Tobacco Use: Medium Risk (08/26/2024)   Patient History    Smoking Tobacco  Use: Former    Smokeless Tobacco Use: Never    Passive Exposure: Not on Actuary Strain: Not on file  Food Insecurity: No Food Insecurity (08/26/2024)   Epic    Worried About Radiation Protection Practitioner of Food in the Last Year: Never true    Ran Out of Food in the Last Year: Never true  Transportation Needs: No Transportation Needs (08/26/2024)   Epic    Lack of Transportation (Medical): No    Lack of Transportation (Non-Medical): No  Physical Activity: Not on file  Stress: Not on file  Social Connections: Not on file  Depression (EYV7-0): Not on file  Alcohol Screen: Low Risk (08/26/2024)   Alcohol Screen    Last Alcohol Screening Score (AUDIT): 0  Housing: Low Risk (08/26/2024)   Epic    Unable to Pay for Housing in the Last Year: No    Number of Times Moved in the Last Year: 0    Homeless in the Last Year: No  Utilities: Not At Risk (08/26/2024)   Epic    Threatened with loss of utilities: No  Health Literacy: Not on file   Additional Social History:   Sleep: Good Estimated Sleeping Duration (Last 24 Hours): 4.00-4.75 hours  Appetite:  Good  Current Medications: Current Facility-Administered Medications  Medication Dose Route Frequency Provider Last Rate Last Admin   albuterol  (VENTOLIN  HFA) 108 (90 Base) MCG/ACT inhaler 1-2 puff  1-2 puff Inhalation Q6H PRN Towana Leita SAILOR, MD       cholecalciferol  (VITAMIN D3) 25 MCG (1000 UNIT) tablet 1,000 Units  1,000 Units Oral Daily Alissah Redmon I, NP   1,000 Units at 08/28/24 9080   cyclobenzaprine  (FLEXERIL ) tablet 10 mg  10 mg Oral TID Navneet Schmuck I, NP   10 mg at 08/28/24 1249   haloperidol  (HALDOL ) tablet 5 mg  5 mg Oral TID PRN Onuoha, Chinwendu V, NP       And   diphenhydrAMINE  (BENADRYL ) capsule 50 mg  50 mg Oral TID PRN Onuoha, Chinwendu V, NP       haloperidol  lactate (HALDOL ) injection 5 mg  5 mg Intramuscular TID PRN Onuoha, Chinwendu V, NP       And   diphenhydrAMINE  (BENADRYL ) injection 50 mg  50 mg Intramuscular TID  PRN Onuoha, Chinwendu V, NP       And   LORazepam  (ATIVAN ) injection 2 mg  2 mg Intramuscular TID PRN Onuoha, Chinwendu V, NP       haloperidol  lactate (HALDOL ) injection 10 mg  10 mg Intramuscular TID PRN Onuoha, Chinwendu V, NP       And   diphenhydrAMINE  (BENADRYL ) injection 50 mg  50 mg Intramuscular TID PRN Onuoha, Chinwendu V, NP       And   LORazepam  (ATIVAN ) injection 2 mg  2 mg Intramuscular TID PRN Onuoha, Chinwendu V, NP       DULoxetine  (CYMBALTA ) DR capsule 60 mg  60 mg Oral Daily Shateka Petrea I, NP   60 mg at 08/28/24 9080   gabapentin  (NEURONTIN ) capsule 600 mg  600 mg Oral TID Collene Gouge I, NP   600 mg at 08/28/24 1249   hydrOXYzine  (ATARAX ) tablet 25 mg  25 mg Oral TID PRN Collene Gouge I, NP   25 mg at 08/27/24 2108   LORazepam  (ATIVAN ) tablet 1 mg  1 mg Oral TID PRN Collene Gouge I, NP       magnesium  hydroxide (MILK OF MAGNESIA) suspension 30 mL  30 mL Oral Daily PRN Onuoha, Chinwendu V, NP       multivitamins with iron  tablet 1 tablet  1 tablet Oral Daily Collene Gouge I, NP   1 tablet at 08/28/24 9081   pantoprazole  (PROTONIX ) EC tablet 40 mg  40 mg Oral Daily Marchele Decock I, NP   40 mg at 08/28/24 0919   traZODone  (DESYREL ) tablet 50 mg  50 mg Oral QHS PRN Onuoha, Chinwendu V, NP   50 mg at 08/27/24 2108   valACYclovir  (VALTREX ) tablet 500 mg  500 mg Oral BID Collene Gouge I, NP   500 mg at 08/28/24 0918   vitamin B-12 (CYANOCOBALAMIN ) tablet 100 mcg  100 mcg Oral Daily Fletcher Ostermiller I, NP   100 mcg at 08/28/24 9081   Lab Results: No results found for this or any previous visit (from the past 48 hours).  Blood Alcohol level:  Lab Results  Component Value Date   [facility] <15 08/25/2024   Metabolic Disorder Labs: No results found for: HGBA1C, MPG No results found for: PROLACTIN No results found for: CHOL, TRIG, HDL, CHOLHDL, VLDL, LDLCALC  Physical Findings: AIMS:  ,  ,  ,  ,  ,  ,   CIWA:    COWS:     Musculoskeletal: Strength & Muscle Tone: within  normal limits Gait & Station: normal Patient leans: N/A  Psychiatric Specialty Exam:  Presentation  General Appearance:  Casual; Fairly Groomed  Eye Contact: Good  Speech: Clear and Coherent; Normal Rate  Speech Volume: Normal  Handedness: Right   Mood and Affect  Mood: Anxious; Depressed  Affect: Tearful; Depressed  Thought Process  Thought Processes: Coherent; Goal Directed; Linear  Descriptions of Associations:Intact  Orientation:Full (Time, Place and Person)  Thought Content:Logical  History of Schizophrenia/Schizoaffective disorder: NA  Duration of Psychotic Symptoms: NA  Hallucinations:Hallucinations: None  Ideas of Reference:None  Suicidal Thoughts:Suicidal Thoughts: No  Homicidal Thoughts:Homicidal Thoughts: No  Sensorium  Memory: Immediate Good; Recent Good; Remote Good  Judgment: Good  Insight: Good  Executive Functions  Concentration: Fair  Attention Span: Fair  Recall: Good  Fund of Knowledge: Fair  Language: Good  Psychomotor Activity  Psychomotor Activity: Psychomotor Activity: Normal  Assets  Assets: Communication Skills; Desire for Improvement; Housing; Physical Health; Resilience; Social Support  Sleep  Sleep: Sleep: Good Number of Hours of Sleep: 7.5  Physical Exam: Physical Exam Vitals and nursing note reviewed.  HENT:     Head: Normocephalic.     Nose: Nose normal.     Mouth/Throat:     Pharynx: Oropharynx is clear.  Cardiovascular:     Pulses: Normal pulses.  Genitourinary:    Comments: Deferred. Musculoskeletal:        General: Normal range of motion.     Cervical back: Normal range of motion.  Skin:    General: Skin is dry.  Neurological:     General: No focal deficit present.     Mental  Status: She is alert and oriented to person, place, and time.    Review of Systems  Constitutional:  Negative for chills, diaphoresis and fever.  HENT:  Negative for congestion and sore throat.    Respiratory:  Negative for cough, shortness of breath and wheezing.   Cardiovascular:  Negative for chest pain and palpitations.  Gastrointestinal:  Negative for abdominal pain, constipation, diarrhea, heartburn, nausea and vomiting.  Genitourinary:  Negative for dysuria.  Musculoskeletal:  Negative for joint pain and myalgias.  Skin:  Negative for itching and rash.  Neurological:  Negative for dizziness, tingling, tremors, sensory change, speech change, focal weakness, seizures, loss of consciousness, weakness and headaches.  Endo/Heme/Allergies:        Allergies: PCN.  Psychiatric/Behavioral:  Positive for depression (Improving.). Negative for hallucinations, memory loss, substance abuse and suicidal ideas. The patient is not nervous/anxious and does not have insomnia.    Blood pressure (!) 104/56, pulse 91, temperature 97.7 F (36.5 C), temperature source Oral, resp. rate 18, height 5' 4 (1.626 m), weight 115.6 kg, SpO2 94%. Body mass index is 43.74 kg/m.  Treatment Plan Summary: Daily contact with patient to assess and evaluate symptoms and progress in treatment and Medication management.   Principal/active diagnoses.  MDD (major depressive disorder), recurrent severe, without psychosis  Bipolar I disorder, most recent episode depressed (HCC) GAD (generalized anxiety disorder).  Plan: The risks/benefits/side-effects/alternatives to the medications in use were discussed in detail with the patient and time was given for patient's questions. The patient consents to medication trial.    -Discontinued Alprazolam  1 mg po twice daily prn for anxiety.  -Initiated Lorazepam  1 mg po tid prn for severe anxiety. -Continue Vitamin D3 1000 unit daily for bone health.  -Continue on Flexeril  10 mg po tid for muscle spasms.  -Continue on Cymbalta  60 mg po daily for depression.  -Continue on gabapentin  600 mg po tid for pain.  -Continue Hydroxyzine  25 mg po tid prn for anxiety.  -Continue  Trazodone  50 mg po Q hs prn for insomnia.   Other medical issues..  -Continue multivitamin with iron  1 tab daily for supplementation.  -Continue Protonix  40 mg po Q am for acid reflux.  -Continue Valtrex  500 mg po bid for herpes.  -Continue B-12 1 tablet po daily for supplementation.    Other PRNS -Continue Tylenol  650 mg every 6 hours PRN for mild pain -Continue Maalox 30 ml Q 4 hrs PRN for indigestion -Continue MOM 30 ml po Q 6 hrs for constipation.  -Continue Albuterol  inhaler 1-2 puffs Q 6 hours prn for SOB.   Safety and Monitoring: Voluntary admission to inpatient psychiatric unit for safety, stabilization and treatment Daily contact with patient to assess and evaluate symptoms and progress in treatment Patient's case to be discussed in multi-disciplinary team meeting Observation Level : q15 minute checks Vital signs: q12 hours Precautions: Safety   Discharge Planning: Social work and case management to assist with discharge planning and identification of hospital follow-up needs prior to discharge Estimated LOS: 5-7 days Discharge Concerns: Need to establish a safety plan; Medication compliance and effectiveness Discharge Goals: Return home with outpatient referrals for mental health follow-up including medication management/psychotherapy    Mac Bolster, NP, pmhnp, fnp-bc. 08/28/2024, 5:00 PM

## 2024-08-28 NOTE — BHH Group Notes (Signed)
 Patient did attend Music Therapy Group.

## 2024-08-28 NOTE — [facility] Group Notes (Signed)
 Adult Psychoeducational Group Note  Date:  08/28/2024 Time:  8:48 PM  Group Topic/Focus:  Wrap-Up Group:   The focus of this group is to help patients review their daily goal of treatment and discuss progress on daily workbooks.  Participation Level:  Active  Participation Quality:  Appropriate  Affect:  Appropriate  Cognitive:  Appropriate  Insight: Appropriate  Engagement in Group:  Engaged  Modes of Intervention:  Education  Additional Comments:  The engament group family freud coping skills topic  Lang Donia Law 08/28/2024, 8:48 PM

## 2024-08-28 NOTE — Group Note (Signed)
 Occupational Therapy Group Note  Group Topic:Coping Skills  Group Date: 08/28/2024 Start Time: 1500 End Time: 1530 Facilitators: Dot [place] MATSU, OT   Group Description: Group encouraged increased engagement and participation through discussion and activity focused on Coping Ahead. Patients were split up into teams and selected a card from a stack of positive coping strategies. Patients were instructed to act out/charade the coping skill for other peers to guess and receive points for their team. Discussion followed with a focus on identifying additional positive coping strategies and patients shared how they were going to cope ahead over the weekend while continuing hospitalization stay.  Therapeutic Goal(s): Identify positive vs negative coping strategies. Identify coping skills to be used during hospitalization vs coping skills outside of hospital/at home Increase participation in therapeutic group environment and promote engagement in treatment   Participation Level: Engaged   Participation Quality: Independent   Behavior: Appropriate   Speech/Thought Process: Relevant   Affect/Mood: Appropriate   Insight: Fair   Judgement: Fair      Modes of Intervention: Education  Patient Response to Interventions:  Attentive   Plan: Continue to engage patient in OT groups 2 - 3x/week.  08/28/2024  [place] MATSU Dot, OT   Soleil Mas, OT

## 2024-08-28 NOTE — Plan of Care (Signed)
   Problem: Education: Goal: Emotional status will improve Outcome: Progressing Goal: Mental status will improve Outcome: Progressing

## 2024-08-28 NOTE — [facility] Group Notes (Signed)
 Patient did not attend Nutrition Group.

## 2024-08-28 NOTE — Progress Notes (Incomplete)
 I met with Gina Cunningham to provide support around grief and uncertainty about her health.  She has been very worried about being told she has liver cirrhosis.  This has been especially difficult because she had an uncle who died of cirrhosis and in his case, it was caused by alcohol use.  Her Sherlean faith

## 2024-08-28 NOTE — Group Note (Signed)
 Date:  08/28/2024 Time:  9:39 AM  Group Topic/Focus: Goals Group Goals Group:   The focus of this group is to help patients establish daily goals to achieve during treatment and discuss how the patient can incorporate goal setting into their daily lives to aide in recovery.    Participation Level:  Active  Participation Quality:  Appropriate  Affect:  Appropriate  Cognitive:  Appropriate  Insight: Good  Engagement in Group:  Engaged  Modes of Intervention:  Discussion  Additional Comments:  Patient attended and participated in Goals Group.  Gina Cunningham Gina Cunningham 08/28/2024, 9:39 AM

## 2024-08-28 NOTE — Group Note (Signed)
 LCSW Group Therapy Note   Group Date: 08/28/2024 Start Time: 1100 End Time: 1200   Participation:  patient was present and actively participated in the discussion.  Type of Therapy:  Group Therapy   Topic:  Money Matters: Creating Stability, Confidence and Peace of Mind  Objective: To help participants understand the impact of financial stability on well-being through the lens of Maslow's Hierarchy of Needs and develop practical strategies for budgeting, saving, and debt repayment.  Goals: Increase awareness of spending habits and financial priorities, recognizing how money supports basic needs, security, and relationships. Develop simple budgeting and saving strategies to enhance stability and peace of mind.  Reduce financial stress by creating a realistic debt repayment plan, supporting long-term confidence and well-being.  Summary:  Participants explored how financial stability connects to basic needs, relationships, and self-esteem using Maslow's Hierarchy. They discussed budgeting, saving, and debt repayment strategies, identifying small, manageable changes. Through interactive discussion and self-reflection, they gained insight into their financial habits and created personal action steps for improvement.  Therapeutic Modalities Used: Elements of Cognitive Behavioral Therapy (CBT) - Addressing financial stress and thought patterns. Psychoeducation - Engineer, agricultural. Elements of Motivational Interviewing (MI) - Encouraging realistic, achievable changes. Group Support - Reducing shame and stress through shared experiences.   Kerissa Coia O Kassadie Pancake, LCSW 08/28/2024  12:45 PM
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
## 2024-08-28 NOTE — Progress Notes (Signed)
(  Sleep Hours) -5 (Any PRNs that were needed, meds refused, or side effects to meds)- hydroxyzine , trazodone  (Any disturbances and when (visitation, over night)- none reported (Concerns raised by the patient)- none reported (SI/HI/AVH)- denies all

## 2024-08-29 LAB — TSH: TSH: 2.16 u[IU]/mL (ref 0.350–4.500)

## 2024-08-29 LAB — LIPID PANEL
Cholesterol: 93 mg/dL (ref 0–200)
HDL: 21 mg/dL — ABNORMAL LOW
LDL Cholesterol: 49 mg/dL (ref 0–99)
Total CHOL/HDL Ratio: 4.5 ratio
Triglycerides: 116 mg/dL
VLDL: 23 mg/dL (ref 0–40)

## 2024-08-29 LAB — HEMOGLOBIN A1C
Hgb A1c MFr Bld: 4.8 % (ref 4.8–5.6)
Mean Plasma Glucose: 91.06 mg/dL

## 2024-08-29 NOTE — Group Note (Signed)
 Date:  08/29/2024 Time:  10:17 AM  Group Topic/Focus: Goals and orientation Goals Group:   The focus of this group is to help patients establish daily goals to achieve during treatment and discuss how the patient can incorporate goal setting into their daily lives to aide in recovery. Orientation:   The focus of this group is to educate the patient on the purpose and policies of crisis stabilization and provide a format to answer questions about their admission.  The group details unit policies and expectations of patients while admitted.    Participation Level:  Active  Participation Quality:  Appropriate  Affect:  Appropriate  Cognitive:  Alert  Insight: Appropriate  Engagement in Group:  Engaged  Modes of Intervention:  Discussion  Additional Comments:    Gina Cunningham 08/29/2024, 10:17 AM

## 2024-08-29 NOTE — B[facility] Suicide Risk Assessment (Signed)
 [facility] INPATIENT:  Family/Significant Other Suicide Prevention Education  Suicide Prevention Education:  Education Completed; Gina Cunningham (aunt) [phone],  (name of family member/significant other) has been identified by the patient as the family member/significant other with whom the patient will be residing, and identified as the person(s) who will aid the patient in the event of a mental health crisis (suicidal ideations/suicide attempt).  With written consent from the patient, the family member/significant other has been provided the following suicide prevention education, prior to the and/or following the discharge of the patient.  The suicide prevention education provided includes the following: Suicide risk factors Suicide prevention and interventions National Suicide Hotline telephone number [facility] assessment telephone number [facility] Emergency Assistance 911 [facility] and/or Residential Mobile Crisis Unit telephone number  Request made of family/significant other to: Remove weapons (e.g., guns, rifles, knives), all items previously/currently identified as safety concern.   Remove drugs/medications (over-the-counter, prescriptions, illicit drugs), all items previously/currently identified as a safety concern.  Gina states she has no safety concerns regarding patient's discharge back home. Gina denies knowledge of Gina Cunningham's access to weapons and knows who to contact in the event of a mental health crisis. Gina states the patient appears much stronger than when she was originally admitted.  The family member/significant other verbalizes understanding of the suicide prevention education information provided.  The family member/significant other agrees to remove the items of safety concern listed above.  Gina Cunningham 08/29/2024, 3:11 PM

## 2024-08-29 NOTE — Plan of Care (Signed)
   Problem: Education: Goal: Knowledge of [facility] Education information/materials will improve Outcome: Progressing Goal: Emotional status will improve Outcome: Progressing Goal: Mental status will improve Outcome: Progressing Goal: Verbalization of understanding the information provided will improve Outcome: Progressing   Problem: Activity: Goal: Interest or engagement in activities will improve Outcome: Progressing

## 2024-08-29 NOTE — Group Note (Signed)
 Date:  08/29/2024 Time:  11:44 AM  Group Topic/Focus: Social wellness Wellness Toolbox:   The focus of this group is to discuss various aspects of wellness, balancing those aspects and exploring ways to increase the ability to experience wellness.  Patients will create a wellness toolbox for use upon discharge.    Participation Level:  Active  Participation Quality:  Appropriate  Affect:  Appropriate  Cognitive:  Alert  Insight: Appropriate  Engagement in Group:  Engaged  Modes of Intervention:  Discussion  Additional Comments:    Gina Cunningham 08/29/2024, 11:44 AM

## 2024-08-29 NOTE — Group Note (Unsigned)
 Date:  08/29/2024 Time:  9:25 PM  Group Topic/Focus:  Wrap-Up Group:   The focus of this group is to help patients review their daily goal of treatment and discuss progress on daily workbooks.     Participation Level:  {BHH PARTICIPATION OZCZO:77735}  Participation Quality:  {BHH PARTICIPATION QUALITY:22265}  Affect:  {BHH AFFECT:22266}  Cognitive:  {BHH COGNITIVE:22267}  Insight: {BHH Insight2:20797}  Engagement in Group:  {BHH ENGAGEMENT IN HMNLE:77731}  Modes of Intervention:  {BHH MODES OF INTERVENTION:22269}  Additional Comments:  ***  Bari Moats 08/29/2024, 9:25 PM

## 2024-08-29 NOTE — Group Note (Signed)
 Date:  08/29/2024 Time:  10:45 AM  Group Topic/Focus: grab the mic Patients get a word and think of a lyric with that word in it.    Participation Level:  Active   Kaleeya Hancock M Karrie Fluellen 08/29/2024, 10:45 AM

## 2024-08-29 NOTE — Progress Notes (Signed)
" °   08/29/24 2133  Psych Admission Type (Psych Patients Only)  Admission Status Involuntary  Psychosocial Assessment  Patient Complaints None  Eye Contact Fair  Facial Expression Flat  Affect Sad  Speech Logical/coherent  Interaction Assertive  Motor Activity Other (Comment) (WDL)  Appearance/Hygiene Unremarkable  Behavior Characteristics Appropriate to situation  Mood Pleasant  Thought Process  Coherency WDL  Content WDL  Delusions None reported or observed  Perception WDL  Hallucination None reported or observed  Judgment WDL  Confusion None  Danger to Self  Current suicidal ideation? Denies  Agreement Not to Harm Self Yes  Description of Agreement verbal  Danger to Others  Danger to Others None reported or observed    "

## 2024-08-29 NOTE — Progress Notes (Signed)
(  Sleep Hours) - 6.25  (Any PRNs that were needed, meds refused, or side effects to meds)- 50MG  TRAZODONE , NO MEDS REFUSED, NO SIDE EFFECTS TO MEDS  (Any disturbances and when (visitation, over night)- N/A  (Concerns raised by the patient)- N/A (SI/HI/AVH)- DENIES

## 2024-08-29 NOTE — Group Note (Signed)
 Recreation Therapy Group Note   Group Topic:General Recreation  Group Date: 08/29/2024 Start Time: 0932 End Time: 1006 Facilitators: Niv Darley-McCall, LRT,CTRS Location: [address] Day[address]   Group Topic/Focus: General Recreation   Goal Area(s) Addresses:  Patient will use appropriate game play with peers.   Patient will follow the rules of the game.  Behavioral Response: Engaged   Intervention: Karaoke  Activity: Grab The Mic. LRT selects a word from the stack. Patients then had to sing a song lyric with the word printed on the card. Each card has a number in the top corner, if a patient got the correct answer, the number is the amount of points they get. The person with the most points at the end, wins the game.     Affect/Mood: Appropriate   Participation Level: Engaged   Participation Quality: Independent   Behavior: Appropriate   Speech/Thought Process: Focused   Insight: Good   Judgement: Good   Modes of Intervention: Competitive Play   Patient Response to Interventions:  Engaged   Education Outcome:  In group clarification offered    Clinical Observations/Individualized Feedback: Pt was bright and engaged in activity. Pt was focused on thinking of the lyrics to go along with words presented. Pt was interactive and social with peers as well.      Plan: Continue to engage patient in RT group sessions 2-3x/week.   Sha Amer-McCall, LRT,CTRS 08/29/2024 11:32 AM

## 2024-08-29 NOTE — Progress Notes (Cosign Needed)
 [facility] MD Progress Note  08/29/2024 5:15 PM Darothy Moes  MRN:  [id number]  Reason for admission: 54 year old Caucasian female with prior hx of major depressive disorder, generalized anxiety disorder, bipolar disorder & previous psychiatric admission/treatments at the [facility]). She is being admitted to the [facility] form the [facility] with complaints of worsening symptoms of depression & passive suicidal ideations without plans or intent to hurt herself. This was apparently triggered by patient learning that she has liver cirrhosis during her weight loss surgery. Patient has hx of suicide attempts by overdose on some medications & laceration of her writs.   Daily notes: [place] is seen this morning in her room. Chart reviewed. The chart finding discussed with the team. She presents alert, oriented & aware of situation. She is visible on the unit, attending group session. She is presenting with an improving affect, good eye contact & verbally responsive. She reports, I'm doing good, just having little aches & pain here & there. My mood is improving. I am attending every group session, learning coping skills. We all did karaoke this morning & played drums. It was amazing. I had a good breakfast this morning. I slept well last night. I did talk to my daughter & my aunt. I know I have my support system. That makes me feel good. My family are there for me. Zanita currently denies any SIHI, AVH, delusional thoughts or paranoia. She does not appear to be responding to any internal stimuli. If Sybrina maintains stability by tomorrow, she will be discharged. There are no changes made on her current plan of care. Reviewed current lab results, stable.  Principal Problem: MDD (major depressive disorder), recurrent severe, without psychosis (HCC)  Diagnosis: Principal Problem:   MDD (major depressive disorder), recurrent severe, without psychosis (HCC) Active Problems:   Bipolar I disorder, most recent  episode depressed (HCC)   GAD (generalized anxiety disorder)  Total Time spent with patient: 35 minutes/  Past Psychiatric History: See H&P.  Past Medical History:  Past Medical History:  Diagnosis Date   Anxiety    Asthma    Bipolar 1 disorder (HCC)    Depression    Fibromyalgia    Sciatica     Past Surgical History:  Procedure Laterality Date   ABDOMINAL HYSTERECTOMY     APPENDECTOMY     CESAREAN SECTION     CHOLECYSTECTOMY     Family History: History reviewed. No pertinent family history.  Family Psychiatric  History: See H&P.  Social History:  Social History   Substance and Sexual Activity  Alcohol Use Not Currently     Social History   Substance and Sexual Activity  Drug Use Not Currently    Social History   Socioeconomic History   Marital status: Married    Spouse name: Not on file   Number of children: Not on file   Years of education: Not on file   Highest education level: Not on file  Occupational History   Not on file  Tobacco Use   Smoking status: Former    Current packs/day: 1.00    Types: Cigarettes   Smokeless tobacco: Never  Vaping Use   Vaping status: Some Days  Substance and Sexual Activity   Alcohol use: Not Currently   Drug use: Not Currently   Sexual activity: Not on file  Other Topics Concern   Not on file  Social History Narrative   Not on file   Social Drivers of Health  Tobacco Use: Medium Risk (08/26/2024)   Patient History    Smoking Tobacco Use: Former    Smokeless Tobacco Use: Never    Passive Exposure: Not on Actuary Strain: Not on file  Food Insecurity: No Food Insecurity (08/26/2024)   Epic    Worried About Programme Researcher, Broadcasting/film/video in the Last Year: Never true    Ran Out of Food in the Last Year: Never true  Transportation Needs: No Transportation Needs (08/26/2024)   Epic    Lack of Transportation (Medical): No    Lack of Transportation (Non-Medical): No  Physical Activity: Not on file  Stress:  Not on file  Social Connections: Not on file  Depression (EYV7-0): Not on file  Alcohol Screen: Low Risk (08/26/2024)   Alcohol Screen    Last Alcohol Screening Score (AUDIT): 0  Housing: Low Risk (08/26/2024)   Epic    Unable to Pay for Housing in the Last Year: No    Number of Times Moved in the Last Year: 0    Homeless in the Last Year: No  Utilities: Not At Risk (08/26/2024)   Epic    Threatened with loss of utilities: No  Health Literacy: Not on file   Additional Social History:   Sleep: Good Estimated Sleeping Duration (Last 24 Hours): 5.50-6.25 hours  Appetite:  Good  Current Medications: Current Facility-Administered Medications  Medication Dose Route Frequency Provider Last Rate Last Admin   albuterol  (VENTOLIN  HFA) 108 (90 Base) MCG/ACT inhaler 1-2 puff  1-2 puff Inhalation Q6H PRN Towana Leita SAILOR, MD       cholecalciferol  (VITAMIN D3) 25 MCG (1000 UNIT) tablet 1,000 Units  1,000 Units Oral Daily Ariyan Brisendine I, NP   1,000 Units at 08/29/24 0810   cyclobenzaprine  (FLEXERIL ) tablet 10 mg  10 mg Oral TID Adem Costlow I, NP   10 mg at 08/29/24 1244   haloperidol  (HALDOL ) tablet 5 mg  5 mg Oral TID PRN Onuoha, Chinwendu V, NP       And   diphenhydrAMINE  (BENADRYL ) capsule 50 mg  50 mg Oral TID PRN Onuoha, Chinwendu V, NP       haloperidol  lactate (HALDOL ) injection 5 mg  5 mg Intramuscular TID PRN Onuoha, Chinwendu V, NP       And   diphenhydrAMINE  (BENADRYL ) injection 50 mg  50 mg Intramuscular TID PRN Onuoha, Chinwendu V, NP       And   LORazepam  (ATIVAN ) injection 2 mg  2 mg Intramuscular TID PRN Onuoha, Chinwendu V, NP       haloperidol  lactate (HALDOL ) injection 10 mg  10 mg Intramuscular TID PRN Onuoha, Chinwendu V, NP       And   diphenhydrAMINE  (BENADRYL ) injection 50 mg  50 mg Intramuscular TID PRN Onuoha, Chinwendu V, NP       And   LORazepam  (ATIVAN ) injection 2 mg  2 mg Intramuscular TID PRN Onuoha, Chinwendu V, NP       DULoxetine  (CYMBALTA ) DR capsule 60 mg   60 mg Oral Daily Zyier Dykema, Mac I, NP   60 mg at 08/29/24 0809   gabapentin  (NEURONTIN ) capsule 600 mg  600 mg Oral TID Aquarius Tremper I, NP   600 mg at 08/29/24 1244   hydrOXYzine  (ATARAX ) tablet 25 mg  25 mg Oral TID PRN Collene Mac I, NP   25 mg at 08/27/24 2108   LORazepam  (ATIVAN ) tablet 1 mg  1 mg Oral TID PRN Collene Mac FERNS, NP  1 mg at 08/29/24 9188   magnesium  hydroxide (MILK OF MAGNESIA) suspension 30 mL  30 mL Oral Daily PRN Onuoha, Chinwendu V, NP       multivitamins with iron  tablet 1 tablet  1 tablet Oral Daily Collene Gouge I, NP   1 tablet at 08/29/24 0809   pantoprazole  (PROTONIX ) EC tablet 40 mg  40 mg Oral Daily Emmett Arntz I, NP   40 mg at 08/29/24 9189   traZODone  (DESYREL ) tablet 50 mg  50 mg Oral QHS PRN Onuoha, Chinwendu V, NP   50 mg at 08/28/24 2116   valACYclovir  (VALTREX ) tablet 500 mg  500 mg Oral BID Collene Gouge I, NP   500 mg at 08/29/24 0810   vitamin B-12 (CYANOCOBALAMIN ) tablet 100 mcg  100 mcg Oral Daily Collene Gouge I, NP   100 mcg at 08/29/24 9190   Lab Results:  Results for orders placed or performed during the hospital encounter of 08/26/24 (from the past 48 hours)  Lipid panel     Status: Abnormal   Collection Time: 08/29/24  6:24 AM  Result Value Ref Range   Cholesterol 93 0 - 200 mg/dL    Comment:        ATP III CLASSIFICATION:  <200     mg/dL   Desirable  799-760  mg/dL   Borderline High  >=759    mg/dL   High           Triglycerides 116 <150 mg/dL   HDL 21 (L) >59 mg/dL   Total CHOL/HDL Ratio 4.5 RATIO   VLDL 23 0 - 40 mg/dL   LDL Cholesterol 49 0 - 99 mg/dL    Comment:        Total Cholesterol/HDL:CHD Risk Coronary Heart Disease Risk Table                     Men   Women  1/2 Average Risk   3.4   3.3  Average Risk       5.0   4.4  2 X Average Risk   9.6   7.1  3 X Average Risk  23.4   11.0        Use the calculated Patient Ratio above and the CHD Risk Table to determine the patient's CHD Risk.        ATP III CLASSIFICATION (LDL):   <100     mg/dL   Optimal  899-870  mg/dL   Near or Above                    Optimal  130-159  mg/dL   Borderline  839-810  mg/dL   High  >809     mg/dL   Very High Performed at [facility], [address], [place], [place] [address]   TSH     Status: None   Collection Time: 08/29/24  6:24 AM  Result Value Ref Range   TSH 2.160 0.350 - 4.500 uIU/mL    Comment: Performed at [facility], [address] [place], [place] [address]  Hemoglobin A1c     Status: None   Collection Time: 08/29/24  6:24 AM  Result Value Ref Range   Hgb A1c MFr Bld 4.8 4.8 - 5.6 %    Comment: (NOTE) Diagnosis of Diabetes The following HbA1c ranges recommended by the American Diabetes Association (ADA) may be used as an aid in the diagnosis of diabetes mellitus.  Hemoglobin  Suggested A1C NGSP%              Diagnosis  <5.7                   Non Diabetic  5.7-6.4                Pre-Diabetic  >6.4                   Diabetic  <7.0                   Glycemic control for                       adults with diabetes.     Mean Plasma Glucose 91.06 mg/dL    Comment: Performed at [facility], [address], [place], [place] [address]    Blood Alcohol level:  Lab Results  Component Value Date   [facility] <15 08/25/2024   Metabolic Disorder Labs: Lab Results  Component Value Date   HGBA1C 4.8 08/29/2024   MPG 91.06 08/29/2024   No results found for: PROLACTIN Lab Results  Component Value Date   CHOL 93 08/29/2024   TRIG 116 08/29/2024   HDL 21 (L) 08/29/2024   CHOLHDL 4.5 08/29/2024   VLDL 23 08/29/2024   LDLCALC 49 08/29/2024    Physical Findings: AIMS:  ,  ,  ,  ,  ,  ,   CIWA:    COWS:     Musculoskeletal: Strength & Muscle Tone: within normal limits Gait & Station: normal Patient leans: N/A  Psychiatric Specialty Exam:  Presentation  General Appearance:  Appropriate for Environment; Casual; Fairly Groomed  Eye  Contact: Good  Speech: Clear and Coherent; Normal Rate  Speech Volume: Normal  Handedness: Right   Mood and Affect  Mood: -- (Improving.)  Affect: Appropriate; Congruent  Thought Process  Thought Processes: Coherent; Goal Directed; Linear  Descriptions of Associations:Intact  Orientation:Full (Time, Place and Person)  Thought Content:Logical  History of Schizophrenia/Schizoaffective disorder: NA  Duration of Psychotic Symptoms: NA  Hallucinations:Hallucinations: None   Ideas of Reference:None  Suicidal Thoughts:Suicidal Thoughts: No   Homicidal Thoughts:Homicidal Thoughts: No   Sensorium  Memory: Immediate Good; Recent Good; Remote Good  Judgment: Fair  Insight: Good  Executive Functions  Concentration: Good  Attention Span: Good  Recall: Good  Fund of Knowledge: Fair  Language: Good  Psychomotor Activity  Psychomotor Activity: Psychomotor Activity: Normal   Assets  Assets: Communication Skills; Desire for Improvement; Housing; Health And Safety Inspector; Resilience; Social Support  Sleep  Sleep: Sleep: Good Number of Hours of Sleep: 7.5   Physical Exam: Physical Exam Vitals and nursing note reviewed.  HENT:     Head: Normocephalic.     Nose: Nose normal.     Mouth/Throat:     Pharynx: Oropharynx is clear.  Cardiovascular:     Pulses: Normal pulses.  Genitourinary:    Comments: Deferred. Musculoskeletal:        General: Normal range of motion.     Cervical back: Normal range of motion.  Skin:    General: Skin is dry.  Neurological:     General: No focal deficit present.     Mental Status: She is alert and oriented to person, place, and time.    Review of Systems  Constitutional:  Negative for chills, diaphoresis and fever.  HENT:  Negative for congestion and sore throat.   Respiratory:  Negative for  cough, shortness of breath and wheezing.   Cardiovascular:  Negative for chest pain and palpitations.   Gastrointestinal:  Negative for abdominal pain, constipation, diarrhea, heartburn, nausea and vomiting.  Genitourinary:  Negative for dysuria.  Musculoskeletal:  Negative for joint pain and myalgias.  Skin:  Negative for itching and rash.  Neurological:  Negative for dizziness, tingling, tremors, sensory change, speech change, focal weakness, seizures, loss of consciousness, weakness and headaches.  Endo/Heme/Allergies:        Allergies: PCN.  Psychiatric/Behavioral:  Positive for depression (Improving.). Negative for hallucinations, memory loss, substance abuse and suicidal ideas. The patient is not nervous/anxious and does not have insomnia.    Blood pressure (!) 105/58, pulse 84, temperature 98.2 F (36.8 C), temperature source Oral, resp. rate 18, height 5' 4 (1.626 m), weight 115.6 kg, SpO2 97%. Body mass index is 43.74 kg/m.  Treatment Plan Summary: Daily contact with patient to assess and evaluate symptoms and progress in treatment and Medication management.   Principal/active diagnoses.  MDD (major depressive disorder), recurrent severe, without psychosis  Bipolar I disorder, most recent episode depressed (HCC) GAD (generalized anxiety disorder).  Plan: The risks/benefits/side-effects/alternatives to the medications in use were discussed in detail with the patient and time was given for patient's questions. The patient consents to medication trial.    -Discontinued Alprazolam  1 mg po twice daily prn for anxiety.  -Continue Lorazepam  1 mg po tid prn for severe anxiety. -Continue Vitamin D3 1000 unit daily for bone health.  -Continue on Flexeril  10 mg po tid for muscle spasms.  -Continue on Cymbalta  60 mg po daily for depression.  -Continue on gabapentin  600 mg po tid for pain.  -Continue Hydroxyzine  25 mg po tid prn for anxiety.  -Continue Trazodone  50 mg po Q hs prn for insomnia.   Other medical issues..  -Continue multivitamin with iron  1 tab daily for supplementation.   -Continue Protonix  40 mg po Q am for acid reflux.  -Continue Valtrex  500 mg po bid for herpes.  -Continue B-12 1 tablet po daily for supplementation.    Other PRNS -Continue Tylenol  650 mg every 6 hours PRN for mild pain -Continue Maalox 30 ml Q 4 hrs PRN for indigestion -Continue MOM 30 ml po Q 6 hrs for constipation.  -Continue Albuterol  inhaler 1-2 puffs Q 6 hours prn for SOB.   Safety and Monitoring: Voluntary admission to inpatient psychiatric unit for safety, stabilization and treatment Daily contact with patient to assess and evaluate symptoms and progress in treatment Patient's case to be discussed in multi-disciplinary team meeting Observation Level : q15 minute checks Vital signs: q12 hours Precautions: Safety   Discharge Planning: Social work and case management to assist with discharge planning and identification of hospital follow-up needs prior to discharge Estimated LOS: 5-7 days Discharge Concerns: Need to establish a safety plan; Medication compliance and effectiveness Discharge Goals: Return home with outpatient referrals for mental health follow-up including medication management/psychotherapy    Mac Bolster, NP, pmhnp, fnp-bc. 08/29/2024, 5:15 PM Patient ID: Natoya Viscomi, female   DOB: [birth year], 54 y.o.   MRN: [id number]

## 2024-08-29 NOTE — Group Note (Signed)
 Date:  08/29/2024 Time:  8:16 PM  Group Topic/Focus:  Making Healthy Choices:   The focus of this group is to help patients identify negative/unhealthy choices they were using prior to admission and identify positive/healthier coping strategies to replace them upon discharge. In context of how diet and microbiome affect mental health.    Participation Level:  Active  Participation Quality:  Appropriate  Affect:  Appropriate  Cognitive:  Appropriate  Insight: Appropriate  Engagement in Group:  Engaged  Modes of Intervention:  Discussion and Education  Additional Comments:    Juliene CHRISTELLA Huddle 08/29/2024, 8:16 PM

## 2024-08-29 NOTE — Group Note (Signed)
 Date:  08/29/2024 Time:  8:13 PM  Group Topic/Focus:  Wrap-Up Group:   The focus of this group is to help patients review their daily goal of treatment and discuss progress on daily workbooks.    Participation Level:  Active  Participation Quality:  Appropriate  Affect:  Appropriate  Cognitive:  Appropriate  Insight: Appropriate  Engagement in Group:  Engaged  Modes of Intervention:  Discussion  Additional Comments:   Pt attended AA meeting  Miray Mancino A Shatona Andujar 08/29/2024, 8:13 PM

## 2024-08-29 NOTE — Plan of Care (Signed)
   Problem: Education: Goal: Emotional status will improve Outcome: Progressing Goal: Mental status will improve Outcome: Progressing   Problem: Activity: Goal: Interest or engagement in activities will improve Outcome: Progressing

## 2024-08-29 NOTE — Progress Notes (Signed)
" °   08/29/24 1500  Psych Admission Type (Psych Patients Only)  Admission Status Involuntary  Psychosocial Assessment  Patient Complaints Anxiety;Worrying  Eye Contact Fair  Facial Expression Flat  Affect Sad  Speech Logical/coherent  Interaction Assertive  Motor Activity Slow  Appearance/Hygiene Unremarkable  Behavior Characteristics Anxious  Mood Anxious;Sad  Thought Process  Coherency WDL  Content WDL  Delusions None reported or observed  Perception WDL  Hallucination None reported or observed  Judgment WDL  Confusion None  Danger to Self  Current suicidal ideation? Denies  Description of Suicide Plan No Plan  Agreement Not to Harm Self Yes  Description of Agreement Verbal  Danger to Others  Danger to Others None reported or observed    "

## 2024-08-29 NOTE — [facility] Suicide Risk Assessment (Signed)
 [facility] INPATIENT:  Family/Significant Other Suicide Prevention Education  Suicide Prevention Education:  Education Completed; Karizma Cheek (significant other) [phone],  (name of family member/significant other) has been identified by the patient as the family member/significant other with whom the patient will be residing, and identified as the person(s) who will aid the patient in the event of a mental health crisis (suicidal ideations/suicide attempt).  With written consent from the patient, the family member/significant other has been provided the following suicide prevention education, prior to the and/or following the discharge of the patient.  The suicide prevention education provided includes the following: Suicide risk factors Suicide prevention and interventions National Suicide Hotline telephone number [facility] assessment telephone number [facility] Emergency Assistance 911 [facility] and/or Residential Mobile Crisis Unit telephone number  Request made of family/significant other to: Remove weapons (e.g., guns, rifles, knives), all items previously/currently identified as safety concern.   Remove drugs/medications (over-the-counter, prescriptions, illicit drugs), all items previously/currently identified as a safety concern.  Karleen states there are no guns or weapons in the home and knows who to contact in the event of a mental health crisis.   The family member/significant other verbalizes understanding of the suicide prevention education information provided.  The family member/significant other agrees to remove the items of safety concern listed above.  Louetta Lame 08/29/2024, 4:05 PM
